# Patient Record
Sex: Female | Born: 1995 | Race: White | Hispanic: No | Marital: Married | Smoking: Never smoker
Health system: Southern US, Community
[De-identification: ages and names within clinical notes are randomized; demographics above are authoritative.]

## PROBLEM LIST (undated history)

## (undated) DIAGNOSIS — T7840XA Allergy, unspecified, initial encounter: Secondary | ICD-10-CM

## (undated) DIAGNOSIS — J45909 Unspecified asthma, uncomplicated: Secondary | ICD-10-CM

## (undated) DIAGNOSIS — K219 Gastro-esophageal reflux disease without esophagitis: Secondary | ICD-10-CM

## (undated) HISTORY — DX: Unspecified asthma, uncomplicated: J45.909

## (undated) HISTORY — PX: WISDOM TOOTH EXTRACTION: SHX21

## (undated) HISTORY — DX: Allergy, unspecified, initial encounter: T78.40XA

## (undated) HISTORY — DX: Gastro-esophageal reflux disease without esophagitis: K21.9

## (undated) HISTORY — PX: CHOLECYSTECTOMY: SHX55

---

## 2016-08-09 ENCOUNTER — Encounter: Payer: Self-pay | Admitting: *Deleted

## 2016-08-10 ENCOUNTER — Ambulatory Visit: Payer: Self-pay | Admitting: Advanced Practice Midwife

## 2016-08-11 NOTE — Progress Notes (Signed)
Chart opened in error

## 2016-09-03 ENCOUNTER — Encounter: Payer: Self-pay | Admitting: Advanced Practice Midwife

## 2017-05-03 ENCOUNTER — Encounter (HOSPITAL_COMMUNITY): Payer: Self-pay

## 2018-12-25 DIAGNOSIS — D500 Iron deficiency anemia secondary to blood loss (chronic): Secondary | ICD-10-CM | POA: Insufficient documentation

## 2019-01-09 NOTE — L&D Delivery Note (Addendum)
Patient: Amber Donovan MRN: [id number]  GBS status: GBS positive, IAP given- PCN ppx  Patient is a 24 y.o. now G3P3003 s/p NSVD at [redacted]w[redacted]d, who was admitted for eIOL. AROM 1h 42m prior to delivery with clear fluid.   Delivery Note At 6:04 PM a viable female was delivered via Vaginal, Spontaneous (Presentation: Left Occiput Anterior).  APGAR: 9, 9; weight 7 lb 9 oz (3430 g).   Placenta status: Spontaneous, Intact.  Cord: 3 vessels with the following complications: None.  Cord pH: cord gases not collected  Called to room due to precipitous delivery. Infant delivered just prior to provider entering the room in bed with nursing staff present. Infant with spontaneous cry, placed on mother's abdomen, dried and bulb suctioned. Cord clamped x 2 after 1-minute delay, and cut by mother. Cord blood drawn. Placenta delivered spontaneously with gentle cord traction. Fundus firm with massage and Pitocin. Perineum inspected and found to have a 1st degree perineal laceration, which was repaired with 3.0 vicryl with good hemostasis achieved.  Anesthesia: Local lidocaine Episiotomy: None Lacerations: 1st degree;Perineal Suture Repair: 3.0 vicryl Est. Blood Loss (mL): 125  Mom to postpartum.  Baby to Couplet care / Skin to Skin.  Simone Autry-Lott 12/02/2019, 9:12 PM  GME ATTESTATION:  I saw and evaluated the patient. I agree with the findings and the plan of care as documented in the resident's note.  Alric Seton, MD OB Fellow, Faculty [facility], Center for [facility] 12/03/2019 7:44 AM

## 2019-07-20 ENCOUNTER — Encounter: Payer: Self-pay | Admitting: *Deleted

## 2019-07-21 ENCOUNTER — Encounter: Payer: Self-pay | Admitting: Advanced Practice Midwife

## 2019-07-21 ENCOUNTER — Other Ambulatory Visit: Payer: Self-pay

## 2019-07-21 ENCOUNTER — Ambulatory Visit (INDEPENDENT_AMBULATORY_CARE_PROVIDER_SITE_OTHER): Payer: Medicaid Other | Admitting: Advanced Practice Midwife

## 2019-07-21 ENCOUNTER — Other Ambulatory Visit (HOSPITAL_COMMUNITY)
Admission: RE | Admit: 2019-07-21 | Discharge: 2019-07-21 | Disposition: A | Discharge status: Home / Self Care | Payer: Medicaid Other | Source: Ambulatory Visit | Attending: Advanced Practice Midwife | Admitting: Advanced Practice Midwife

## 2019-07-21 VITALS — BP 97/55 | HR 91 | Ht 64.0 in | Wt 154.0 lb

## 2019-07-21 DIAGNOSIS — Z3482 Encounter for supervision of other normal pregnancy, second trimester: Secondary | ICD-10-CM

## 2019-07-21 DIAGNOSIS — Z3480 Encounter for supervision of other normal pregnancy, unspecified trimester: Secondary | ICD-10-CM | POA: Insufficient documentation

## 2019-07-21 DIAGNOSIS — Z3A19 19 weeks gestation of pregnancy: Secondary | ICD-10-CM | POA: Diagnosis not present

## 2019-07-21 NOTE — Progress Notes (Signed)
Subjective:   Amber Donovan is a 24 y.o. G3P2002 at [redacted]w[redacted]d by midtrimester ultrasound being seen today for her first obstetrical visit.  Her obstetrical history is significant for SVD x 2 and does not have a problem list on file.. Patient does intend to breast feed. Pregnancy history fully reviewed.  Patient reports no complaints.  HISTORY: OB History  Gravida Para Term Preterm AB Living  3 2 2  0 0 2  SAB TAB Ectopic Multiple Live Births  0 0 0 0 2    # Outcome Date GA Lbr Len/2nd Weight Sex Delivery Anes PTL Lv  3 Current           2 Term      Vag-Spont     1 Term      Vag-Spont      Past Medical History:  Diagnosis Date  . Asthma   . GERD (gastroesophageal reflux disease)    Past Surgical History:  Procedure Laterality Date  . CHOLECYSTECTOMY    . WISDOM TOOTH EXTRACTION     Family History  Problem Relation Age of Onset  . Diabetes Mother   . Diabetes Father   . Prostate cancer Maternal Grandfather   . Diabetes Paternal Grandmother   . Hodgkin's lymphoma Paternal Grandfather    Social History   Tobacco Use  . Smoking status: Never Smoker  . Smokeless tobacco: Never Used  Vaping Use  . Vaping Use: Never used  Substance Use Topics  . Alcohol use: Never  . Drug use: Never   Allergies  Allergen Reactions  . Hydrocodone Nausea Only    Headache  . Hydrocodone-Acetaminophen Nausea And Vomiting and Other (See Comments)    Had this reaction at age 3 with wisdom teeth extraction  . Sunflower Oil Anaphylaxis   Current Outpatient Medications on File Prior to Visit  Medication Sig Dispense Refill  . Prenatal Vit-Fe Fumarate-FA (PRENATAL VITAMINS PO) Take 1 tablet by mouth daily.     No current facility-administered medications on file prior to visit.     Indications for ASA therapy (per uptodate) One of the following: Previous pregnancy with preeclampsia, especially early onset and with an adverse outcome No Multifetal gestation No Chronic hypertension  No Type 1 or 2 diabetes mellitus No Chronic kidney disease No Autoimmune disease (antiphospholipid syndrome, systemic lupus erythematosus) No   Two or more of the following: Nulliparity No Obesity (body mass index >30 kg/m2) No Family history of preeclampsia in mother or sister No Age ?35 years No Sociodemographic characteristics (African American race, low socioeconomic level) No Personal risk factors (eg, previous pregnancy with low birth weight or small for gestational age infant, previous adverse pregnancy outcome [eg, stillbirth], interval >10 years between pregnancies) No   Indications for early 1 hour GTT (per uptodate)  BMI >25 (>23 in Asian women) AND one of the following  Gestational diabetes mellitus in a previous pregnancy No Glycated hemoglobin ?5.7 percent (39 mmol/mol), impaired glucose tolerance, or impaired fasting glucose on previous testing No First-degree relative with diabetes No High-risk race/ethnicity (eg, African American, Latino, Native American, 12 American, Pacific Islander) No History of cardiovascular disease No Hypertension or on therapy for hypertension No High-density lipoprotein cholesterol level <35 mg/dL ([place] mmol/L) and/or a triglyceride level >250 mg/dL (9.83 mmol/L) No Polycystic ovary syndrome No Physical inactivity No Other clinical condition associated with insulin resistance (eg, severe obesity, acanthosis nigricans) No Previous birth of an infant weighing ?4000 g No Previous stillbirth of unknown cause No  Exam   Vitals:   07/21/19 1017 07/21/19 1019  BP: (!) 97/55   Pulse: 91   Weight: 154 lb (69.9 kg)   Height:  5\' 4"  (1.626 m)   Fetal Heart Rate (bpm): 155  Uterus:     Pelvic Exam: Perineum: no hemorrhoids, normal perineum   Vulva: normal external genitalia, no lesions   Vagina:  normal mucosa, normal discharge   Cervix: no lesions and normal, pap smear done.    Adnexa: normal adnexa and no mass, fullness, tenderness    Bony Pelvis: average  System: General: well-developed, well-nourished female in no acute distress   Breast:  normal appearance, no masses or tenderness   Skin: normal coloration and turgor, no rashes   Neurologic: oriented, normal, negative, normal mood   Extremities: normal strength, tone, and muscle mass, ROM of all joints is normal   HEENT PERRLA, extraocular movement intact and sclera clear, anicteric   Mouth/Teeth mucous membranes moist, pharynx normal without lesions and dental hygiene good   Neck supple and no masses   Cardiovascular: regular rate and rhythm   Respiratory:  no respiratory distress, normal breath sounds   Abdomen: soft, non-tender; bowel sounds normal; no masses,  no organomegaly     Assessment:   Pregnancy: G3P2002 There are no problems to display for this patient.    Plan:  1. [redacted] weeks gestation of pregnancy --Pt on Ortho Evra patch, LMP 05/08/19, so pt was expecting to be 10 weeks today.  Bedside  05/10/19 reveals 19 week pregnancy. --Pt surprised and overwhelmed, tearful at one point with news that she is due in December.  - January MFM OB COMP + 14 WK; Future - Cytology - PAP( [place]) - Obstetric panel - HIV antibody (with reflex) - Hepatitis C Antibody - Culture, OB Urine - 01-21-2006 bedside; Future - Babyscripts Schedule Optimization  2. Supervision of other normal pregnancy, antepartum --Anticipatory guidance about next visits/weeks of pregnancy given. --Next visit in 4 weeks in the office   Initial labs drawn. Continue prenatal vitamins. Discussed and offered genetic screening options, including Quad screen/AFP, NIPS testing, and option to decline testing. Benefits/risks/alternatives reviewed. Pt aware that anatomy US is form of genetic screening with lower accuracy in detecting trisomies than blood work.  Pt declines genetic screening today. First trimester screen, Quad screen and NIPS: declined. Ultrasound discussed; fetal anatomic survey:  ordered. Problem list reviewed and updated. The nature of [facility] with multiple MDs and other Advanced Practice Providers was explained to patient; also emphasized that residents, students are part of our team. Routine obstetric precautions reviewed. No follow-ups on file.   [facility], CNM 07/21/19 5:06 PM

## 2019-07-21 NOTE — Progress Notes (Signed)
Bedside U/S shows single IUP with FHT of 155 BPM.  HC measures 16.37 cm  GA [redacted]w[redacted]d and HC measures 16.87 cm GA is [redacted]w[redacted]d

## 2019-07-22 ENCOUNTER — Encounter: Payer: Self-pay | Admitting: Obstetrics and Gynecology

## 2019-07-22 LAB — OBSTETRIC PANEL
Absolute Monocytes: 482 cells/uL (ref 200–950)
Antibody Screen: NOT DETECTED
Basophils Absolute: 27 cells/uL (ref 0–200)
Basophils Relative: 0.3 %
Eosinophils Absolute: 18 cells/uL (ref 15–500)
Eosinophils Relative: 0.2 %
HCT: 40.7 % (ref 35.0–45.0)
Hemoglobin: 13.1 g/dL (ref 11.7–15.5)
Hepatitis B Surface Ag: NONREACTIVE
Lymphs Abs: 1838 cells/uL (ref 850–3900)
MCH: 30.1 pg (ref 27.0–33.0)
MCHC: 32.2 g/dL (ref 32.0–36.0)
MCV: 93.6 fL (ref 80.0–100.0)
MPV: 10.3 fL (ref 7.5–12.5)
Monocytes Relative: 5.3 %
Neutro Abs: 6734 cells/uL (ref 1500–7800)
Neutrophils Relative %: 74 %
Platelets: 203 10*3/uL (ref 140–400)
RBC: 4.35 10*6/uL (ref 3.80–5.10)
RDW: 13.1 % (ref 11.0–15.0)
RPR Ser Ql: NONREACTIVE
Rubella: 11.6 Index
Total Lymphocyte: 20.2 %
WBC: 9.1 10*3/uL (ref 3.8–10.8)

## 2019-07-22 LAB — CYTOLOGY - PAP
Chlamydia: NEGATIVE
Comment: NEGATIVE
Comment: NORMAL
Diagnosis: NEGATIVE
Neisseria Gonorrhea: NEGATIVE

## 2019-07-22 LAB — HEPATITIS C ANTIBODY
Hepatitis C Ab: NONREACTIVE
SIGNAL TO CUT-OFF: 0.01 (ref <1.00)

## 2019-07-22 LAB — HIV ANTIBODY (ROUTINE TESTING W REFLEX): HIV 1&2 Ab, 4th Generation: NONREACTIVE

## 2019-07-23 LAB — CULTURE, OB URINE

## 2019-07-23 LAB — URINE CULTURE, OB REFLEX

## 2019-07-31 ENCOUNTER — Ambulatory Visit: Payer: Medicaid Other

## 2019-08-05 ENCOUNTER — Other Ambulatory Visit: Payer: Self-pay | Admitting: *Deleted

## 2019-08-05 ENCOUNTER — Ambulatory Visit: Payer: Medicaid Other | Attending: Advanced Practice Midwife

## 2019-08-05 ENCOUNTER — Other Ambulatory Visit: Payer: Self-pay

## 2019-08-05 DIAGNOSIS — Z363 Encounter for antenatal screening for malformations: Secondary | ICD-10-CM | POA: Diagnosis not present

## 2019-08-05 DIAGNOSIS — Z3A22 22 weeks gestation of pregnancy: Secondary | ICD-10-CM | POA: Diagnosis not present

## 2019-08-05 DIAGNOSIS — Z3A19 19 weeks gestation of pregnancy: Secondary | ICD-10-CM | POA: Diagnosis present

## 2019-08-05 DIAGNOSIS — Z331 Pregnant state, incidental: Secondary | ICD-10-CM | POA: Diagnosis not present

## 2019-08-05 DIAGNOSIS — Z362 Encounter for other antenatal screening follow-up: Secondary | ICD-10-CM

## 2019-08-05 DIAGNOSIS — Z3687 Encounter for antenatal screening for uncertain dates: Secondary | ICD-10-CM | POA: Diagnosis not present

## 2019-08-05 IMAGING — US US MFM OB COMP +14 WKS
1 series · 13 of 28 positions shown · non-contrast
Comparison: none

[Series 1: us mfm ob comp +14 wks · 13 of 110 slices shown]
[im 5/110]
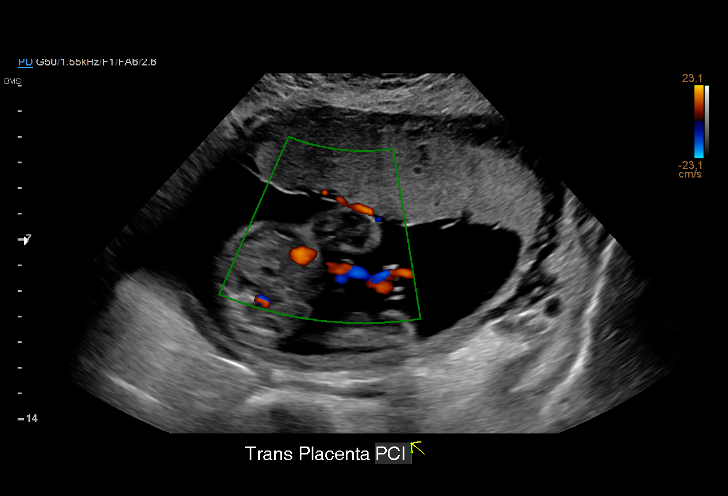
[im 13/110]
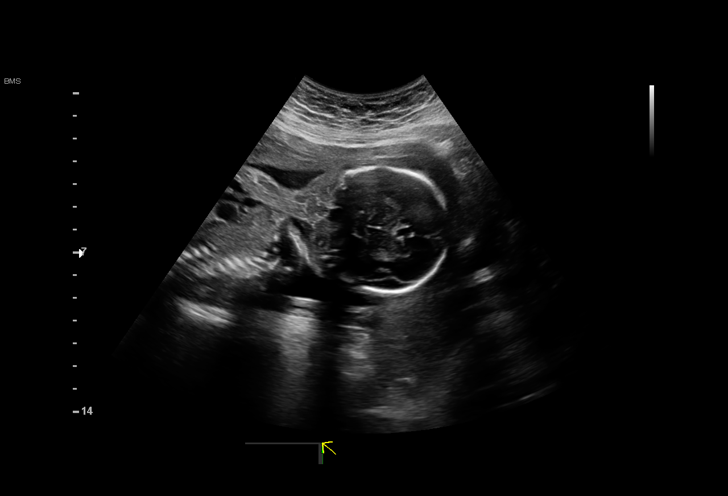
[im 21/110]
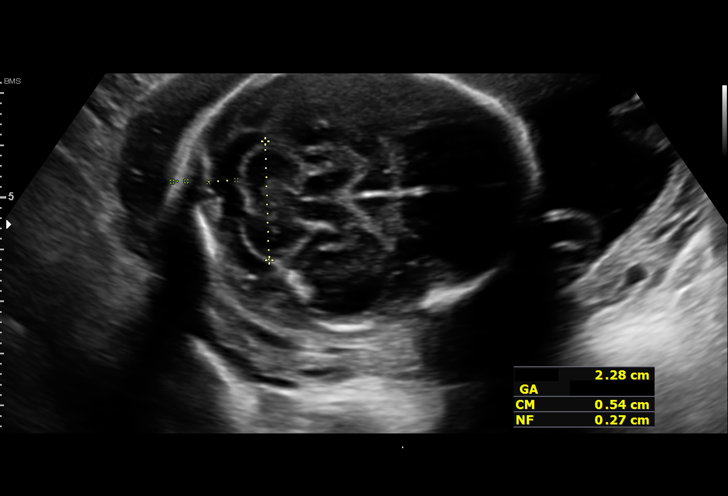
[im 29/110]
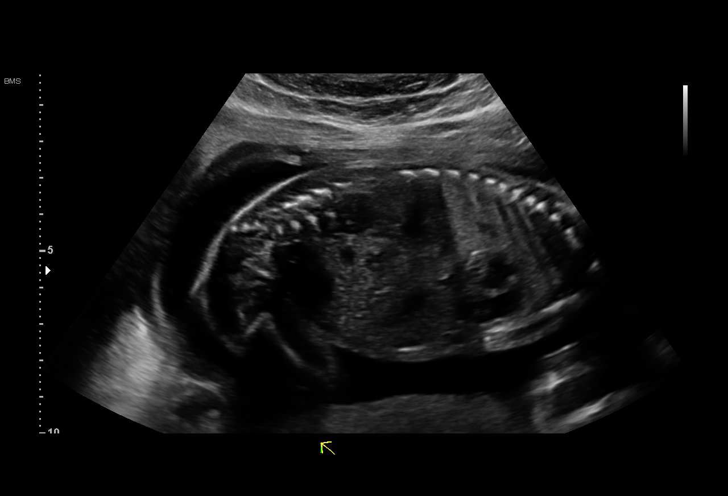
[im 37/110]
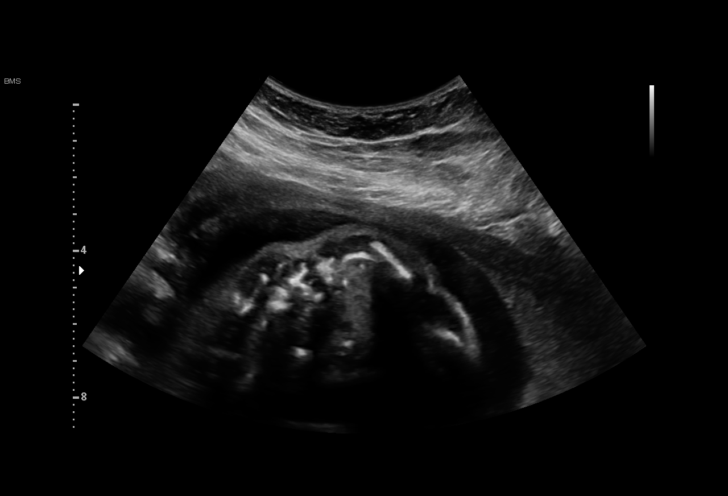
[im 45/110]
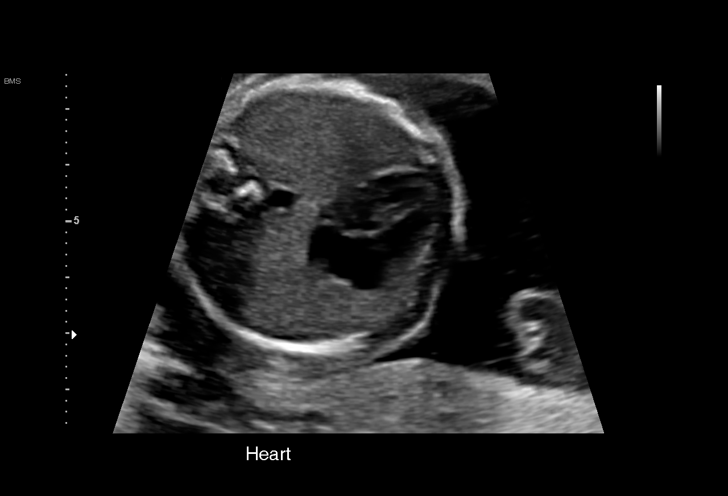
[im 57/110]
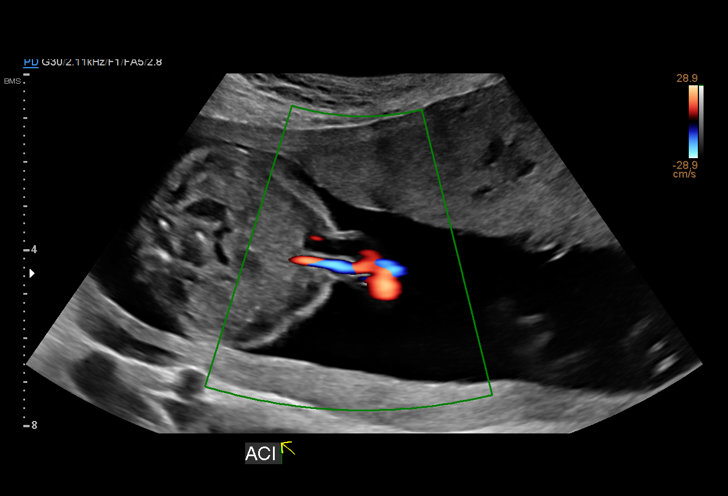
[im 65/110]
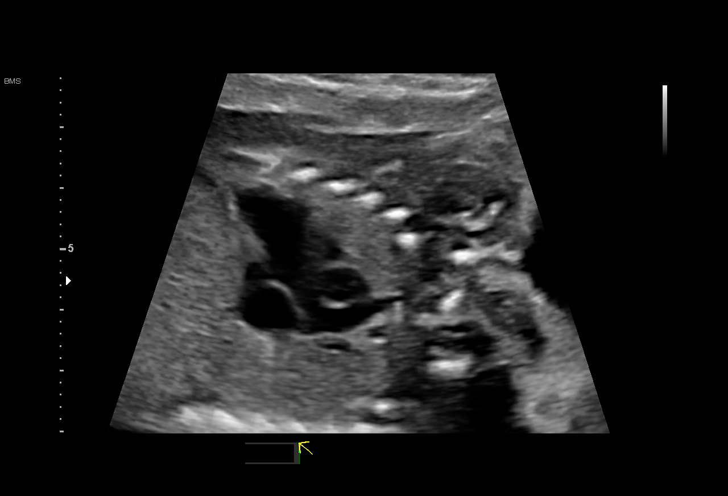
[im 73/110]
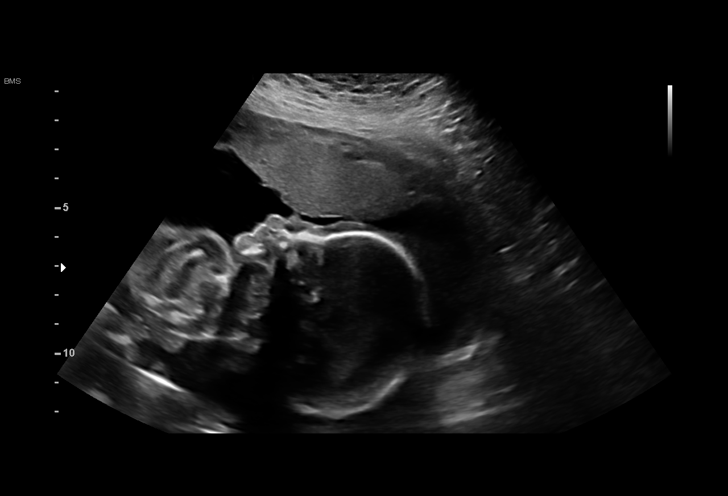
[im 81/110]
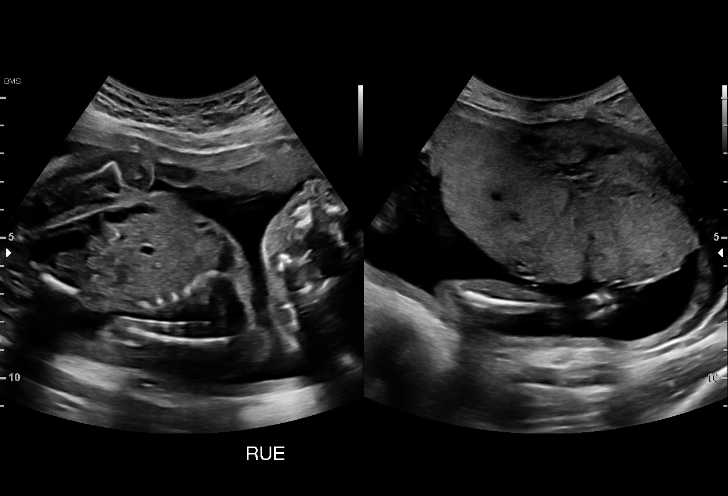
[im 89/110]
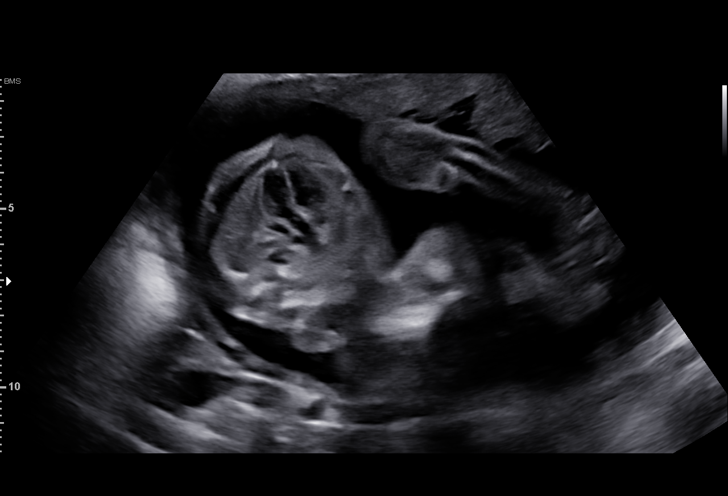
[im 97/110]
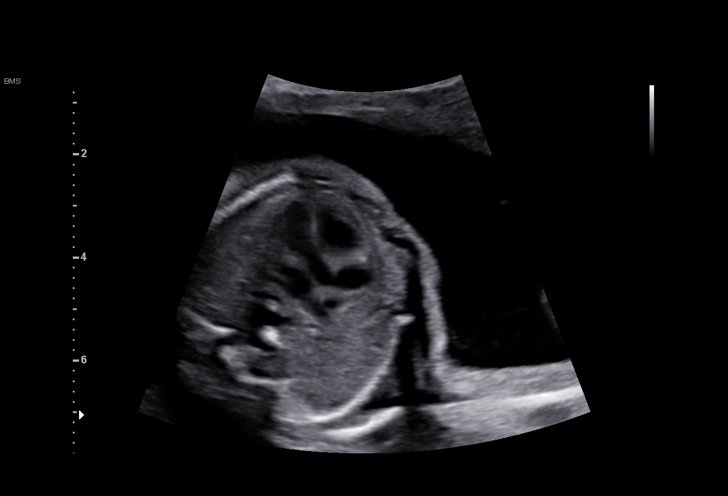
[im 105/110]
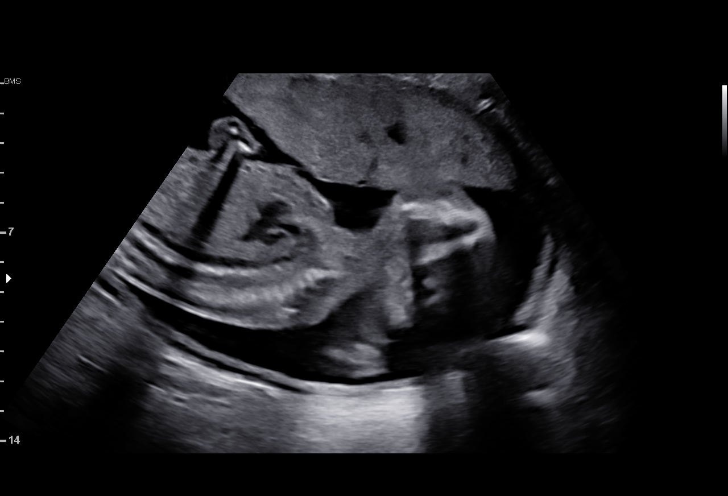

[13 of 28 positions shown; findings below may reference images not displayed]

Indications

 Encounter for uncertain dates
 Encounter for antenatal screening for
 malformations
 22 weeks gestation of pregnancy
Fetal Evaluation

 Num Of Fetuses:         1
 Fetal Heart Rate(bpm):  153
 Cardiac Activity:       Observed
 Presentation:           Cephalic
 Placenta:               Anterior
 P. Cord Insertion:      Visualized, central

 Amniotic Fluid
 AFI FV:      Within normal limits

                             Largest Pocket(cm)

Biometry

 BPD:      55.4  mm     G. Age:  22w 6d         59  %    CI:        75.49   %    70 - 86
                                                         FL/HC:      18.6   %    19.2 -
 HC:      202.2  mm     G. Age:  22w 3d         28  %    HC/AC:      1.13        1.05 -
 AC:      178.7  mm     G. Age:  22w 5d         47  %    FL/BPD:     67.9   %    71 - 87
 FL:       37.6  mm     G. Age:  22w 0d         22  %    FL/AC:      21.0   %    20 - 24
 HUM:      36.4  mm     G. Age:  22w 5d         47  %
 CER:      22.8  mm     G. Age:  21w 2d         32  %
 NFT:       2.7  mm

 LV:        4.4  mm
 CM:        5.4  mm

 Est. FW:     503  gm      1 lb 2 oz     36  %
OB History

 Gravidity:    3
 Living:       2
Gestational Age

 LMP:           12w 5d        Date:  05/08/19                 EDD:   02/12/20
 U/S Today:     22w 4d                                        EDD:   12/05/19
 Best:          22w 4d     Det. By:  U/S (08/05/19)           EDD:   12/05/19
Anatomy

 Cranium:               Appears normal         LVOT:                   Appears normal
 Cavum:                 Appears normal         Aortic Arch:            Appears normal
 Ventricles:            Appears normal         Ductal Arch:            Not well visualized
 Choroid Plexus:        Appears normal         Diaphragm:              Appears normal
 Cerebellum:            Appears normal         Stomach:                Appears normal, left
                                                                       sided
 Posterior Fossa:       Appears normal         Abdomen:                Appears normal
 Nuchal Fold:           Appears normal         Abdominal Wall:         Appears nml (cord
                                                                       insert, abd wall)
 Face:                  Appears normal         Cord Vessels:           Appears normal (3
                        (orbits and profile)                           vessel cord)
 Lips:                  Appears normal         Kidneys:                Appear normal
 Palate:                Not well visualized    Bladder:                Appears normal
 Thoracic:              Appears normal         Spine:                  Ltd views no
                                                                       intracranial signs of
                                                                       NTD
 Heart:                 Appears normal         Upper Extremities:      Appears normal
                        (4CH, axis, and
                        situs)
 RVOT:                  Not well visualized    Lower Extremities:      Appears normal
Cervix Uterus Adnexa

 Cervix
 Length:           3.93  cm.
 Normal appearance by transabdominal scan.

 Uterus
 No abnormality visualized.

 Right Ovary
 Not visualized.

 Left Ovary
 Not visualized.

 Cul De Sac
 No free fluid seen.
 Adnexa
 No adnexal mass visualized.
Comments

 This patient was seen for a detailed fetal anatomy scan as
 she presented late for prenatal care.  She reports two prior
 uncomplicated vaginal deliveries.
 She denies any significant past medical history and denies
 any problems in her current pregnancy.
 As she presented late for prenatal care, she has not had any
 screening tests for fetal aneuploidy drawn in her current
 pregnancy.
 Based on the fetal biometry measurements obtained today,
 her EDC should be changed to December 05, 2019.  There
 was normal amniotic fluid noted on today's ultrasound exam.
 There were no obvious fetal anomalies noted on today's
 ultrasound exam.  However, the views of the fetal anatomy
 were limited today due to the fetal position.
 The patient was informed that anomalies may be missed due
 to technical limitations. If the fetus is in a suboptimal position
 or maternal habitus is increased, visualization of the fetus in
 the maternal uterus may be impaired.
 A follow-up exam was scheduled in 4 weeks to complete the
 views of the fetal anatomy and to confirm her dates.

## 2019-08-18 ENCOUNTER — Ambulatory Visit (INDEPENDENT_AMBULATORY_CARE_PROVIDER_SITE_OTHER): Payer: Medicaid Other

## 2019-08-18 ENCOUNTER — Other Ambulatory Visit: Payer: Self-pay

## 2019-08-18 VITALS — BP 107/60 | HR 87 | Wt 162.0 lb

## 2019-08-18 DIAGNOSIS — Z3A24 24 weeks gestation of pregnancy: Secondary | ICD-10-CM

## 2019-08-18 DIAGNOSIS — Z3492 Encounter for supervision of normal pregnancy, unspecified, second trimester: Secondary | ICD-10-CM

## 2019-08-18 DIAGNOSIS — Z3480 Encounter for supervision of other normal pregnancy, unspecified trimester: Secondary | ICD-10-CM

## 2019-08-18 NOTE — Patient Instructions (Signed)
Safe Medications in Pregnancy   Acne: Benzoyl Peroxide Salicylic Acid  Backache/Headache: Tylenol: 2 regular strength every 4 hours OR              2 Extra strength every 6 hours  Colds/Coughs/Allergies: Benadryl (alcohol free) 25 mg every 6 hours as needed Breath right strips Claritin Cepacol throat lozenges Chloraseptic throat spray Cold-Eeze- up to three times per day Cough drops, alcohol free Flonase (by prescription only) Guaifenesin Mucinex Robitussin DM (plain only, alcohol free) Saline nasal spray/drops Sudafed (pseudoephedrine) & Actifed ** use only after [redacted] weeks gestation and if you do not have high blood pressure Tylenol Vicks Vaporub Zinc lozenges Zyrtec   Constipation: Colace Ducolax suppositories Fleet enema Glycerin suppositories Metamucil Milk of magnesia Miralax Senokot Smooth move tea  Diarrhea: Kaopectate Imodium A-D  *NO pepto Bismol  Hemorrhoids: Anusol Anusol HC Preparation H Tucks  Indigestion: Tums Maalox Mylanta Zantac  Pepcid  Insomnia: Benadryl (alcohol free) 25mg every 6 hours as needed Tylenol PM Unisom, no Gelcaps  Leg Cramps: Tums MagGel  Nausea/Vomiting:  Bonine Dramamine Emetrol Ginger extract Sea bands Meclizine  Nausea medication to take during pregnancy:  Unisom (doxylamine succinate 25 mg tablets) Take one tablet daily at bedtime. If symptoms are not adequately controlled, the dose can be increased to a maximum recommended dose of two tablets daily (1/2 tablet in the morning, 1/2 tablet mid-afternoon and one at bedtime). Vitamin B6 100mg tablets. Take one tablet twice a day (up to 200 mg per day).  Skin Rashes: Aveeno products Benadryl cream or 25mg every 6 hours as needed Calamine Lotion 1% cortisone cream  Yeast infection: Gyne-lotrimin 7 Monistat 7   **If taking multiple medications, please check labels to avoid duplicating the same active ingredients **take medication as directed on  the label ** Do not exceed 4000 mg of tylenol in 24 hours **Do not take medications that contain aspirin or ibuprofen       Coronavirus (COVID-19) and Pregnancy:  Frequently Asked Questions   How might coronavirus affect my pregnancy? The data for COVID-19 is limited, but we know that women with other coronavirus infections (such as SARS-CoV) did NOT have miscarriage or stillbirth at higher rates than the general population.  On the other hand, we know that having other respiratory viral infections during pregnancy, such as flu, has been associated with problems like low birth weight and preterm birth. Also, having a high fever early in pregnancy may increase the risk of certain birth defects.  Could I transmit coronavirus to my baby during pregnancy or delivery? Among the few case studies of infants born to mothers with COVID-19 published in peer-reviewed literature, none of the infants tested positive for the virus. And there have been no reports of mother-to-baby transmission for other coronaviruses (MERS-CoV and SARS-CoV). Also, there was no virus detected in samples of amniotic fluid or breast milk. But there have been a few reports of newborns as young as a few days old with infection, suggesting that a mother can transmit the infection to her infant through close contact after delivery.  Is it safe for me to deliver at a hospital where there have been COVID-19 cases? It should be. We know that COVID-19 is a very scary virus. The good news is that hospitals are taking great precautions to keep patients and healthcare providers safe.  According to the CDC guidelines, when a patient is even suspected to have COVID-19, they should be placed in a negative pressure room. (Think of these   rooms as vacuums that suck and filter the air so it's safe for the other people in the hospital.) If there are no rooms available, these patients should be asked to wait at home until they can be accomodated  safely. This should make it possible for you to deliver at the hospital without putting you or your baby at risk.  Hospitals are also implementing stricter visiting policies to keep patients safe. It's worth calling your hospital to check if there are any new regulations to be aware of.  What plans should I make now in case the hospital system is overwhelmed when it's time for me to deliver? Every hospital is making different plans for dealing with this scenario. Talk with your doctor or midwife once you're at least [redacted] weeks pregnant. I work in healthcare.   I work in healthcare. Should I ask my doctor to excuse me from work until the baby is born? Should I ask my doctor to excuse me from work until the baby is born? What if I work in a school, the travel industry, or some other high-risk setting? Healthcare facilities should take care to limit the exposure of pregnant employees to patients with confirmed or suspected COVID-19, just as they would with other infectious cases. If you continue working, be sure to follow the CDC's risk assessment and infection control guidelines.  If you work in a school, travel industry, or other high-risk setting, talk with your employer about what it's doing to protect employees and minimize infection risks. Wash your hands often.   What if my OB gets COVID-19? If your doctor or midwife tests positive for COVID-19, they will need to be quarantined until they recover and are no longer at risk of transmitting the virus. In this case, you'll be assigned to another OB in your doctor's practice (or you may choose another practitioner yourself). Ask your new OB or your doctor's office if you should self-quarantine or be tested for the virus. (It will depend on when you last saw your provider and when that person tested positive.)  Should we hold off on trying to conceive because of COVID-19? At this time, there's no reason to hold off on trying to get pregnant, but the  data we have is really limited. For example, we don't think the virus causes birth defects or increases your risk of miscarriage. But we don't know for sure whether you could transmit COVID-19 to your baby before or during delivery. We also don't know if the virus lives in semen or can be sexually transmitted.  We have a babymoon scheduled in the next few months - should we cancel? Yes. At this time, the virus has reached more than 140 countries, and there are travel bans to [place], most of [place], and [place]. Places where large numbers of people gather are at highest risk, especially airports and cruise ships.  If you were planning travel in the [place] note that any travel setting increases your risk of exposure, and there are already many places where everyone is being asked to stay home. To see how the virus is spreading, check The [place] Times map based on CDC data.  For the most current advice to help you avoid exposure, check the CDC's COVID-19 travel page.  Will the hospital separate me from my newborn and keep the baby in quarantine? If you don't have COVID-19 and have not been exposed to the virus, the hospital will not separate you from your newborn. If you   do test positive for COVID-19 or have been exposed but have no symptoms, the CDC, American College of Obstetricians and Gynecologists, and the Society for Maternal-Fetal Medicine all recommend that you be separated from your baby to decrease the risk of transmission to the baby. This would last until you are no longer at risk of transmitting the virus.  This scenario would, of course, be beyond heartbreaking. Talk to the hospital, your baby's pediatrician, and your family about how to plan for care of your baby in the event that you have to be separated after delivery. And try to make sure you have the emotional support you would need to endure the sadness and stress of having to potentially wait weeks to meet your newborn.   My hospital is  restricting visitors and only allowing one support person. If my support person leaves after the delivery, will they be allowed to come back? Every hospital has different policies. Contact your hospital or labor and delivery unit a week or so before delivery to get the most up-to-date restrictions. In general, if your support person needs to leave, they would be allowed back unless they knew they were exposed to COVID-19 after leaving your company.  My mom was planning to fly here to help me care for my new baby after delivery. Should I tell her not to come? Yes. If your mom is over 60 or has any serious chronic medical conditions (such as heart disease, lung disease, or diabetes), she is at higher risk of serious illness from COVID-19 and should avoid air travel. And remember that any travel setting increases a person's risk of exposure. So, it may be risky to have her around the baby after she has been traveling. For the most current advice on traveling, check the CDC's COVID-19 travel page. 

## 2019-08-18 NOTE — Progress Notes (Signed)
° °  PRENATAL VISIT NOTE  Subjective:  Amber Donovan is a 24 y.o. G3P2002 at 12w3dbeing seen today for ongoing prenatal care.  She is currently monitored for the following issues for this low-risk pregnancy and has Supervision of other normal pregnancy, antepartum on their problem list.  Patient reports fatigue. Patient has a 26year old and 26year old at home.  Contractions: Not present. Vag. Bleeding: None.  Movement: Present. Denies leaking of fluid.   The following portions of the patient's history were reviewed and updated as appropriate: allergies, current medications, past family history, past medical history, past social history, past surgical history and problem list.   Objective:   Vitals:   08/18/19 0903  BP: 107/60  Pulse: 87  Weight: 162 lb (73.5 kg)    Fetal Status: Fetal Heart Rate (bpm): 147 Fundal Height: 24 cm Movement: Present     General:  Alert, oriented and cooperative. Patient is in no acute distress.  Skin: Skin is warm and dry. No rash noted.   Cardiovascular: Normal heart rate noted  Respiratory: Normal respiratory effort, no problems with respiration noted  Abdomen: Soft, gravid, appropriate for gestational age.  Pain/Pressure: Absent     Pelvic: Cervical exam deferred        Extremities: Normal range of motion.  Edema: None  Mental Status: Normal mood and affect. Normal behavior. Normal judgment and thought content.   Assessment and Plan:  Pregnancy: G3P2002 at 226w3d. Supervision of other normal pregnancy, antepartum - No complaints. Routine care. -Reviewed NOB labs and ultrasound. Follow up anatomy scheduled -Anticipatory guidance of next visit including GTT/labs -Discussed COVID vaccine. Patient unsure but desires more information. She is concerned about reports of people dying from vaccination. Reviewed information available about risks of vaccine vs risks of COVID infection and deaths from those. Reviewed morbidity and mortality of COVID in  pregnancy.   -Patient very concerned with her weight. 7lb weight gain from prepregnancy weight. Discussed expectations for weight gain in pregnancy and normalcy of weight gain so far. Patient very concerned about her weight compared to her weight when she first met her husband. Discussed with patient normalcy of body changes after 3 pregnancies and emphasized importance of healthy food choices and exercise.  -FOB called multiple times during ROB visit. Patient very uncomfortable on phone and rushing to finish visit. Will continue to monitor for signs of abuse.   2. [redacted] weeks gestation of pregnancy   Preterm labor symptoms and general obstetric precautions including but not limited to vaginal bleeding, contractions, leaking of fluid and fetal movement were reviewed in detail with the patient. Please refer to After Visit Summary for other counseling recommendations.   Return in about 4 weeks (around 09/15/2019) for Return OB visit, 2hr GTT and labs.  Future Appointments  Date Time Provider De[place]arez8/25/2021  2:30 PM [facility]20[facility]  2:45 PM [facility] US5 [facility]/07/2019  8:50 AM [place]  08/18/19 10:02 AM

## 2019-09-02 ENCOUNTER — Ambulatory Visit: Payer: Medicaid Other

## 2019-09-11 ENCOUNTER — Ambulatory Visit: Payer: Medicaid Other

## 2019-09-16 ENCOUNTER — Other Ambulatory Visit: Payer: Self-pay

## 2019-09-16 ENCOUNTER — Ambulatory Visit (INDEPENDENT_AMBULATORY_CARE_PROVIDER_SITE_OTHER): Payer: Medicaid Other | Admitting: Obstetrics and Gynecology

## 2019-09-16 VITALS — BP 96/56 | HR 91 | Wt 168.0 lb

## 2019-09-16 DIAGNOSIS — Z3480 Encounter for supervision of other normal pregnancy, unspecified trimester: Secondary | ICD-10-CM

## 2019-09-16 DIAGNOSIS — Z3A28 28 weeks gestation of pregnancy: Secondary | ICD-10-CM

## 2019-09-16 DIAGNOSIS — Z23 Encounter for immunization: Secondary | ICD-10-CM | POA: Diagnosis not present

## 2019-09-16 NOTE — Progress Notes (Signed)
   PRENATAL VISIT NOTE  Subjective:  Amber Donovan is a 24 y.o. G3P2002 at [redacted]w[redacted]d being seen today for ongoing prenatal care.  She is currently monitored for the following issues for this low-risk pregnancy and has Supervision of other normal pregnancy, antepartum on their problem list.  Patient reports no complaints.  Contractions: Not present. Vag. Bleeding: None.  Movement: Present. Denies leaking of fluid.   The following portions of the patient's history were reviewed and updated as appropriate: allergies, current medications, past family history, past medical history, past social history, past surgical history and problem list.   Objective:   Vitals:   09/16/19 0818  BP: (!) 96/56  Pulse: 91  Weight: 168 lb (76.2 kg)    Fetal Status: Fetal Heart Rate (bpm): 143 Fundal Height: 28 cm Movement: Present     General:  Alert, oriented and cooperative. Patient is in no acute distress.  Skin: Skin is warm and dry. No rash noted.   Cardiovascular: Normal heart rate noted  Respiratory: Normal respiratory effort, no problems with respiration noted  Abdomen: Soft, gravid, appropriate for gestational age.  Pain/Pressure: Absent     Pelvic: Cervical exam deferred        Extremities: Normal range of motion.  Edema: Trace  Mental Status: Normal mood and affect. Normal behavior. Normal judgment and thought content.   Assessment and Plan:  Pregnancy: G3P2002 at [redacted]w[redacted]d 1. [redacted] weeks gestation of pregnancy  - 2Hr GTT w/ 1 Hr Carpenter 75 g - HIV antibody (with reflex) - CBC - RPR  2. Supervision of other normal pregnancy, antepartum  - is planning on getting Covid vaccine.  - 2Hr GTT w/ 1 Hr Carpenter 75 g - HIV antibody (with reflex) - CBC - RPR  Preterm labor symptoms and general obstetric precautions including but not limited to vaginal bleeding, contractions, leaking of fluid and fetal movement were reviewed in detail with the patient. Please refer to After Visit Summary for other  counseling recommendations.   No follow-ups on file.  Future Appointments  Date Time Provider Department Center  10/02/2019  8:30 AM Orva Riles, Harolyn Rutherford, NP [facility]    Venia Carbon, NP

## 2019-09-17 LAB — CBC
HCT: 31.3 % — ABNORMAL LOW (ref 35.0–45.0)
Hemoglobin: 10.5 g/dL — ABNORMAL LOW (ref 11.7–15.5)
MCH: 29.2 pg (ref 27.0–33.0)
MCHC: 33.5 g/dL (ref 32.0–36.0)
MCV: 87.2 fL (ref 80.0–100.0)
MPV: 10.6 fL (ref 7.5–12.5)
Platelets: 197 10*3/uL (ref 140–400)
RBC: 3.59 10*6/uL — ABNORMAL LOW (ref 3.80–5.10)
RDW: 12.3 % (ref 11.0–15.0)
WBC: 8.5 10*3/uL (ref 3.8–10.8)

## 2019-09-17 LAB — HIV ANTIBODY (ROUTINE TESTING W REFLEX): HIV 1&2 Ab, 4th Generation: NONREACTIVE

## 2019-09-17 LAB — 2HR GTT W 1 HR, CARPENTER, 75 G
Glucose, 1 Hr, Gest: 85 mg/dL (ref 65–179)
Glucose, 2 Hr, Gest: 71 mg/dL (ref 65–152)
Glucose, Fasting, Gest: 68 mg/dL (ref 65–91)

## 2019-09-17 LAB — RPR: RPR Ser Ql: NONREACTIVE

## 2019-09-18 ENCOUNTER — Encounter: Payer: Medicaid Other | Admitting: Obstetrics and Gynecology

## 2019-10-02 ENCOUNTER — Encounter: Payer: Self-pay | Admitting: Obstetrics and Gynecology

## 2019-10-02 ENCOUNTER — Telehealth (INDEPENDENT_AMBULATORY_CARE_PROVIDER_SITE_OTHER): Payer: Medicaid Other | Admitting: Obstetrics and Gynecology

## 2019-10-02 VITALS — BP 118/78 | Wt 160.0 lb

## 2019-10-02 DIAGNOSIS — Z3A28 28 weeks gestation of pregnancy: Secondary | ICD-10-CM | POA: Insufficient documentation

## 2019-10-02 DIAGNOSIS — K219 Gastro-esophageal reflux disease without esophagitis: Secondary | ICD-10-CM | POA: Insufficient documentation

## 2019-10-02 DIAGNOSIS — Z3480 Encounter for supervision of other normal pregnancy, unspecified trimester: Secondary | ICD-10-CM | POA: Diagnosis not present

## 2019-10-02 MED ORDER — PANTOPRAZOLE SODIUM 40 MG PO TBEC
40.0000 mg | DELAYED_RELEASE_TABLET | Freq: Every day | ORAL | 1 refills | Status: DC
Start: 2019-10-02 — End: 2019-12-04

## 2019-10-02 NOTE — Progress Notes (Signed)
   TELEHEALTH OBSTETRICS VISIT ENCOUNTER NOTE  I connected with Amber Donovan on 10/02/19 at  8:30 AM EDT by telephone at home and verified that I am speaking with the correct person using two identifiers.   I discussed the limitations, risks, security and privacy concerns of performing an evaluation and management service by telephone and the availability of in person appointments. I also discussed with the patient that there may be a patient responsible charge related to this service. The patient expressed understanding and agreed to proceed. Patient is at home, provider is in the office.   Subjective:  Amber Donovan is a 24 y.o. G3P2002 at [redacted]w[redacted]d being followed for ongoing prenatal care.  She is currently monitored for the following issues for this low-risk pregnancy and has Supervision of other normal pregnancy, antepartum; Gastroesophageal reflux disease; and [redacted] weeks gestation of pregnancy on their problem list.  Patient reports heartburn. Reports fetal movement. Denies any contractions, bleeding or leaking of fluid.   The following portions of the patient's history were reviewed and updated as appropriate: allergies, current medications, past family history, past medical history, past social history, past surgical history and problem list.   Objective:   General:  Alert, oriented and cooperative.   Mental Status: Normal mood and affect perceived. Normal judgment and thought content.  Rest of physical exam deferred due to type of encounter  Assessment and Plan:  Pregnancy: G3P2002 at [redacted]w[redacted]d  1. Gastroesophageal reflux disease, unspecified whether esophagitis present   -Hx of heartburn even as a child, Prilosec does not work. Rx: Protonix  -OTC liquid antacid as needed.    2. [redacted] weeks gestation of pregnancy  -Normal 2 hour GTT -Reviewed birth control options; desires BTL. Would like to discuss this more   Preterm labor symptoms and general obstetric precautions including  but not limited to vaginal bleeding, contractions, leaking of fluid and fetal movement were reviewed in detail with the patient.  I discussed the assessment and treatment plan with the patient. The patient was provided an opportunity to ask questions and all were answered. The patient agreed with the plan and demonstrated an understanding of the instructions. The patient was advised to call back or seek an in-person office evaluation/go to [facility]U at [facility] for any urgent or concerning symptoms. Please refer to After Visit Summary for other counseling recommendations.   I provided 11 minutes of non-face-to-face time during this encounter.  Return in about 2 weeks (around 10/16/2019) for Please schedule with MD to discuss BTL; virtual is ok .  No future appointments.  Venia Carbon, NP Center for [employer], [facility]

## 2019-10-22 ENCOUNTER — Telehealth (INDEPENDENT_AMBULATORY_CARE_PROVIDER_SITE_OTHER): Payer: Medicaid Other | Admitting: Obstetrics and Gynecology

## 2019-10-22 ENCOUNTER — Encounter: Payer: Self-pay | Admitting: Obstetrics and Gynecology

## 2019-10-22 VITALS — BP 111/79 | HR 120

## 2019-10-22 DIAGNOSIS — Z3A33 33 weeks gestation of pregnancy: Secondary | ICD-10-CM

## 2019-10-22 DIAGNOSIS — K219 Gastro-esophageal reflux disease without esophagitis: Secondary | ICD-10-CM

## 2019-10-22 DIAGNOSIS — O26893 Other specified pregnancy related conditions, third trimester: Secondary | ICD-10-CM

## 2019-10-22 DIAGNOSIS — Z3480 Encounter for supervision of other normal pregnancy, unspecified trimester: Secondary | ICD-10-CM

## 2019-10-22 NOTE — Progress Notes (Signed)
   OBSTETRICS PRENATAL VIRTUAL VISIT ENCOUNTER NOTE  Provider location: Center for [facility] [place]   I connected with [name] on 10/22/19 at  1:30 PM EDT by MyChart Video Encounter at home and verified that I am speaking with the correct person using two identifiers.   I discussed the limitations, risks, security and privacy concerns of performing an evaluation and management service virtually and the availability of in person appointments. I also discussed with the patient that there may be a patient responsible charge related to this service. The patient expressed understanding and agreed to proceed. Subjective:  Amber Donovan is a 24 y.o. G3P2002 at [redacted]w[redacted]d being seen today for ongoing prenatal care.  She is currently monitored for the following issues for this low-risk pregnancy and has Supervision of other normal pregnancy, antepartum; Gastroesophageal reflux disease; and [redacted] weeks gestation of pregnancy on their problem list.  Patient reports no complaints.  Contractions: Irritability. Vag. Bleeding: None.  Movement: Present. Denies any leaking of fluid.   The following portions of the patient's history were reviewed and updated as appropriate: allergies, current medications, past family history, past medical history, past social history, past surgical history and problem list.   Objective:   Vitals:   10/22/19 1337  BP: 111/79  Pulse: (!) 120    Fetal Status:     Movement: Present     General:  Alert, oriented and cooperative. Patient is in no acute distress.  Respiratory: Normal respiratory effort, no problems with respiration noted  Mental Status: Normal mood and affect. Normal behavior. Normal judgment and thought content.  Rest of physical exam deferred due to type of encounter  Imaging: No results found.  Assessment and Plan:  Pregnancy: G3P2002 at [redacted]w[redacted]d 1. [redacted] weeks gestation of pregnancy   2. Supervision of other normal pregnancy,  antepartum Patient reports doing well She reports significant improvement with her heartburn Patient is no longer interested in BTL and is considering her contraception options  Preterm labor symptoms and general obstetric precautions including but not limited to vaginal bleeding, contractions, leaking of fluid and fetal movement were reviewed in detail with the patient. I discussed the assessment and treatment plan with the patient. The patient was provided an opportunity to ask questions and all were answered. The patient agreed with the plan and demonstrated an understanding of the instructions. The patient was advised to call back or seek an in-person office evaluation/go to MAU at [facility] for any urgent or concerning symptoms. Please refer to After Visit Summary for other counseling recommendations.   I provided 15 minutes of face-to-face time during this encounter.  Return in about 18 days (around 11/09/2019) for in person, ROB, Low risk, GBS. Patient prefers Mondays.  No future appointments.  Catalina Antigua, MD Center for [employer], [facility]

## 2019-10-28 ENCOUNTER — Telehealth: Payer: Medicaid Other | Admitting: Nurse Practitioner

## 2019-10-28 DIAGNOSIS — R112 Nausea with vomiting, unspecified: Secondary | ICD-10-CM

## 2019-10-28 NOTE — Progress Notes (Signed)
Based on what you shared with me it looks like you have nausea and vomiting,that should be evaluated in a face to face office visit. Since you are pregnant you will need  To contact your OB doctor for treatment.    NOTE: If you entered your credit card information for this eVisit, you will not be charged. You may see a "hold" on your card for the $35 but that hold will drop off and you will not have a charge processed.  If you are having a true medical emergency please call 911.     For an urgent face to face visit,  has four urgent care centers for your convenience:   . [facility]    [phone]                  Get Driving Directions  [address] [place], [place] [address] . 10 am to 8 pm Monday-Friday . 12 pm to 8 pm Saturday-Sunday   . [facility] at [facility]  [phone]                  Get Driving Directions  [address] [place], [place] [address] . 8 am to 8 pm Monday-Friday . 9 am to 6 pm Saturday . 11 am to 6 pm Sunday   . [facility] at [facility]  [phone]                  Get Driving Directions   [address] [place], [place] [address] . 8 am to 8 pm Monday-Friday . 8 am to 4 pm Saturday-Sunday    . [facility] at [facility] Directions  [phone]  [address], [address] [place], [place] [address]  . Monday-Friday, 12 PM to 6 PM    Your e-visit answers were reviewed by a board certified advanced clinical practitioner to complete your personal care plan.  Thank you for using e-Visits.  \

## 2019-10-29 ENCOUNTER — Inpatient Hospital Stay (HOSPITAL_COMMUNITY)
Admission: AD | Admit: 2019-10-29 | Discharge: 2019-10-29 | Disposition: A | Discharge status: Home / Self Care | Payer: Medicaid Other | Attending: Obstetrics & Gynecology | Admitting: Obstetrics & Gynecology

## 2019-10-29 ENCOUNTER — Encounter (HOSPITAL_COMMUNITY): Payer: Self-pay | Admitting: Obstetrics & Gynecology

## 2019-10-29 ENCOUNTER — Other Ambulatory Visit: Payer: Self-pay

## 2019-10-29 DIAGNOSIS — O99891 Other specified diseases and conditions complicating pregnancy: Secondary | ICD-10-CM | POA: Diagnosis not present

## 2019-10-29 DIAGNOSIS — Z3A34 34 weeks gestation of pregnancy: Secondary | ICD-10-CM | POA: Diagnosis not present

## 2019-10-29 DIAGNOSIS — J45909 Unspecified asthma, uncomplicated: Secondary | ICD-10-CM | POA: Diagnosis not present

## 2019-10-29 DIAGNOSIS — O26893 Other specified pregnancy related conditions, third trimester: Secondary | ICD-10-CM | POA: Diagnosis not present

## 2019-10-29 DIAGNOSIS — O212 Late vomiting of pregnancy: Secondary | ICD-10-CM | POA: Insufficient documentation

## 2019-10-29 DIAGNOSIS — O99613 Diseases of the digestive system complicating pregnancy, third trimester: Secondary | ICD-10-CM | POA: Diagnosis not present

## 2019-10-29 DIAGNOSIS — K219 Gastro-esophageal reflux disease without esophagitis: Secondary | ICD-10-CM | POA: Diagnosis not present

## 2019-10-29 DIAGNOSIS — R1110 Vomiting, unspecified: Secondary | ICD-10-CM | POA: Diagnosis not present

## 2019-10-29 DIAGNOSIS — Z91010 Allergy to peanuts: Secondary | ICD-10-CM | POA: Diagnosis not present

## 2019-10-29 DIAGNOSIS — O98813 Other maternal infectious and parasitic diseases complicating pregnancy, third trimester: Secondary | ICD-10-CM

## 2019-10-29 DIAGNOSIS — O4703 False labor before 37 completed weeks of gestation, third trimester: Secondary | ICD-10-CM | POA: Diagnosis not present

## 2019-10-29 DIAGNOSIS — O4700 False labor before 37 completed weeks of gestation, unspecified trimester: Secondary | ICD-10-CM

## 2019-10-29 DIAGNOSIS — A059 Bacterial foodborne intoxication, unspecified: Secondary | ICD-10-CM | POA: Diagnosis not present

## 2019-10-29 DIAGNOSIS — Z885 Allergy status to narcotic agent status: Secondary | ICD-10-CM | POA: Diagnosis not present

## 2019-10-29 DIAGNOSIS — Z79899 Other long term (current) drug therapy: Secondary | ICD-10-CM | POA: Insufficient documentation

## 2019-10-29 DIAGNOSIS — R197 Diarrhea, unspecified: Secondary | ICD-10-CM | POA: Diagnosis not present

## 2019-10-29 LAB — URINALYSIS, ROUTINE W REFLEX MICROSCOPIC
Bilirubin Urine: NEGATIVE
Glucose, UA: NEGATIVE mg/dL
Hgb urine dipstick: NEGATIVE
Ketones, ur: 80 mg/dL — AB
Leukocytes,Ua: NEGATIVE
Nitrite: NEGATIVE
Protein, ur: NEGATIVE mg/dL
Specific Gravity, Urine: 1.019 (ref 1.005–1.030)
pH: 5 (ref 5.0–8.0)

## 2019-10-29 LAB — WET PREP, GENITAL
Clue Cells Wet Prep HPF POC: NONE SEEN
Sperm: NONE SEEN
Trich, Wet Prep: NONE SEEN
Yeast Wet Prep HPF POC: NONE SEEN

## 2019-10-29 MED ORDER — TERBUTALINE SULFATE 1 MG/ML IJ SOLN: 0.2500 mg | Freq: Once | INTRAMUSCULAR | Status: AC

## 2019-10-29 MED ORDER — TERBUTALINE SULFATE 1 MG/ML IJ SOLN
INTRAMUSCULAR | Status: AC
  Administered 2019-10-29: 0.25 mg via SUBCUTANEOUS
  Filled 2019-10-29: qty 1

## 2019-10-29 MED ORDER — LACTATED RINGERS IV BOLUS
1000.0000 mL | Freq: Once | INTRAVENOUS | Status: AC
  Administered 2019-10-29: 1000 mL via INTRAVENOUS

## 2019-10-29 NOTE — Discharge Instructions (Signed)
Food Poisoning Food poisoning is an illness that is caused by eating or drinking contaminated foods or drinks. In most cases, food poisoning is mild and lasts 1-2 days. However, some cases can be serious, especially for people who have weak body defense systems (immune systems), older people, children and infants, and pregnant women. What are the causes? This condition is caused by contaminated food. Foods can become contaminated with viruses, bacteria, parasites, or mold due to:  Poor personal hygiene, such as poor hand-washing practices.  Storing food improperly, such as not refrigerating raw meat.  Using unclean surfaces for preparing, serving, and storing food.  Cooking or eating with unclean utensils. If contaminated food is eaten, viruses, bacteria, or parasites can harm the intestine. This often causes severe diarrhea. The most common causes of food poisoning include:  Viruses, such as: ? Norovirus. ? Rotavirus.  Bacteria, such as: ? Salmonella. ? Listeria. ? E. coli (Escherichia coli).  Parasites, such as: ? Giardia. ? Toxoplasma gondii. What are the signs or symptoms? Symptoms may take several hours to appear after you consume contaminated food or drink. Symptoms include:  Nausea.  Vomiting.  Cramping.  Diarrhea.  Fever and chills.  Muscle aches.  Dehydration. Dehydration can cause you to be tired and thirsty, have a dry mouth, and urinate less frequently. How is this diagnosed? Your health care provider can diagnose food poisoning with your medical history and a physical exam. This will include asking you what you have recently eaten. You may also have tests, including:  Blood tests.  Stool tests. How is this treated? Treatment focuses on relieving your symptoms and making sure that you are hydrated. You may also be given medicines. In severe cases, hospitalization may be required and you may need to receive fluids through an IV. Follow these instructions  at home: Eating and drinking   Drink enough fluids to keep your urine pale yellow. You may need to drink small amounts of clear liquids frequently.  Avoid milk, caffeine, and alcohol.  Ask your health care provider for specific rehydration instructions.  Eat small, frequent meals rather than large meals. Medicines  Take over-the-counter and prescription medicines only as told by your health care provider. Ask your health care provider if you should continue to take any of your regular prescribed and over-the-counter medicines.  If you were prescribed an antibiotic medicine, take it as told by your health care provider. Do not stop taking the antibiotic even if you start to feel better. General instructions   Wash your hands thoroughly before you prepare food and after you go to the bathroom (use the toilet). Make sure that the people who live with you also wash their hands often.  Rest at home until you feel better.  Clean surfaces that you touch with a product that contains chlorine bleach.  Keep all follow-up visits as told by your health care provider. This is important. How is this prevented?  Wash your hands, food preparation surfaces, and utensils thoroughly before and after you handle raw foods.  Use separate food preparation surfaces and storage spaces for raw meat and for fruits and vegetables.  Keep refrigerated foods colder than 40F (5C).  Serve hot foods immediately or keep them heated above 140F (60C).  Store dry foods in cool, dry spaces away from excess heat or moisture. Throw out any foods that do not smell right or are in cans that are bulging.  Follow approved canning procedures.  Heat canned foods thoroughly before you taste   them.  Drink bottled or sterile water when you travel. Get help right away if:  You have difficulty breathing, swallowing, talking, or moving.  You develop blurred vision.  You cannot eat or drink without vomiting.  You  faint.  Your eyes turn yellow.  Your vomiting or diarrhea is persistent.  Abdominal pain develops, increases, or localizes in one small area.  You have a fever.  You have blood or mucus in your stools, or your stools look dark black and tarry.  You have signs of dehydration, such as: ? Dark urine, very little urine, or no urine. ? Cracked lips. ? Not making tears while crying. ? Dry mouth. ? Sunken eyes. ? Sleepiness. ? Weakness. ? Dizziness. These symptoms may represent a serious problem that is an emergency. Do not wait to see if the symptoms will go away. Get medical help right away. Call your local emergency services (911 in the [place] Do not drive yourself to the hospital. Summary  Food poisoning is an illness that is caused by eating or drinking contaminated foods or drinks.  Symptoms may include nausea, vomiting, diarrhea, muscle aches, cramping, fever, chills, and dehydration.  In most cases, food poisoning is mild and lasts 1-2 days.  In severe cases, hospitalization may be required. This information is not intended to replace advice given to you by your health care provider. Make sure you discuss any questions you have with your health care provider. Document Revised: 10/09/2017 Document Reviewed: 10/09/2017 Elsevier Patient Education  2020 Elsevier Inc.   [employer] of the uterus can occur throughout pregnancy, but they are not always a sign that you are in labor. You may have practice contractions called Braxton Hicks contractions. These false labor contractions are sometimes confused with true labor. What are Deberah Pelton contractions? Braxton Hicks contractions are tightening movements that occur in the muscles of the uterus before labor. Unlike true labor contractions, these contractions do not result in opening (dilation) and thinning of the cervix. Toward the end of pregnancy (32-34 weeks), Braxton Hicks contractions can happen  more often and may become stronger. These contractions are sometimes difficult to tell apart from true labor because they can be very uncomfortable. You should not feel embarrassed if you go to the hospital with false labor. Sometimes, the only way to tell if you are in true labor is for your health care provider to look for changes in the cervix. The health care provider will do a physical exam and may monitor your contractions. If you are not in true labor, the exam should show that your cervix is not dilating and your water has not broken. If there are no other health problems associated with your pregnancy, it is completely safe for you to be sent home with false labor. You may continue to have Braxton Hicks contractions until you go into true labor. How to tell the difference between true labor and false labor True labor  Contractions last 30-70 seconds.  Contractions become very regular.  Discomfort is usually felt in the top of the uterus, and it spreads to the lower abdomen and low back.  Contractions do not go away with walking.  Contractions usually become more intense and increase in frequency.  The cervix dilates and gets thinner. False labor  Contractions are usually shorter and not as strong as true labor contractions.  Contractions are usually irregular.  Contractions are often felt in the front of the lower abdomen and in the groin.  Contractions  may go away when you walk around or change positions while lying down.  Contractions get weaker and are shorter-lasting as time goes on.  The cervix usually does not dilate or become thin. Follow these instructions at home:   Take over-the-counter and prescription medicines only as told by your health care provider.  Keep up with your usual exercises and follow other instructions from your health care provider.  Eat and drink lightly if you think you are going into labor.  If Braxton Hicks contractions are making you  uncomfortable: ? Change your position from lying down or resting to walking, or change from walking to resting. ? Sit and rest in a tub of warm water. ? Drink enough fluid to keep your urine pale yellow. Dehydration may cause these contractions. ? Do slow and deep breathing several times an hour.  Keep all follow-up prenatal visits as told by your health care provider. This is important. Contact a health care provider if:  You have a fever.  You have continuous pain in your abdomen. Get help right away if:  Your contractions become stronger, more regular, and closer together.  You have fluid leaking or gushing from your vagina.  You pass blood-tinged mucus (bloody show).  You have bleeding from your vagina.  You have low back pain that you never had before.  You feel your baby's head pushing down and causing pelvic pressure.  Your baby is not moving inside you as much as it used to. Summary  Contractions that occur before labor are called Braxton Hicks contractions, false labor, or practice contractions.  Braxton Hicks contractions are usually shorter, weaker, farther apart, and less regular than true labor contractions. True labor contractions usually become progressively stronger and regular, and they become more frequent.  Manage discomfort from [facility] contractions by changing position, resting in a warm bath, drinking plenty of water, or practicing deep breathing. This information is not intended to replace advice given to you by your health care provider. Make sure you discuss any questions you have with your health care provider. Document Revised: 12/07/2016 Document Reviewed: 05/10/2016 Elsevier Patient Education  2020 [employer].

## 2019-10-29 NOTE — MAU Note (Signed)
Patient reports eating Dennys and then started having N/V/D afterwards.  Presented to the ED for eval of possible food poisoning where they told her she was contracting and dilated 1 cm.  Denies any complications w/ her pregnancy.  No LOF/VB.  + FM.

## 2019-10-29 NOTE — MAU Provider Note (Signed)
Chief Complaint:  Nausea, Emesis, and Contractions   First Provider Initiated Contact with Patient 10/29/19 0434     HPI: Amber Donovan is a 24 y.o. G3P2002 at [redacted]w[redacted]d who was sent to MAU via [facility] from [facility] in [place] for evaluation of preterm labor. She ate at [facility] the day before yesterday and 24hrs later began having diarrhea with an episode of emesis. She has had diarrhea all day and began having contractions q2-51min. Dr. Lendell Caprice at [facility] called with report and stated pt was "nothing on my exam" but the RN report stated the pt was 1cm. Denies vaginal bleeding, leaking of fluid, decreased fetal movement, fever, falls, or recent illness.   Past Medical History:  Diagnosis Date  . Asthma   . GERD (gastroesophageal reflux disease)    OB History  Gravida Para Term Preterm AB Living  3 2 2     2   SAB TAB Ectopic Multiple Live Births          2    # Outcome Date GA Lbr Len/2nd Weight Sex Delivery Anes PTL Lv  3 Current           2 Term      Vag-Spont     1 Term      Vag-Spont      Past Surgical History:  Procedure Laterality Date  . CHOLECYSTECTOMY    . WISDOM TOOTH EXTRACTION     Family History  Problem Relation Age of Onset  . Diabetes Mother   . Diabetes Father   . Prostate cancer Maternal Grandfather   . Diabetes Paternal Grandmother   . Hodgkin's lymphoma Paternal Grandfather    Social History   Tobacco Use  . Smoking status: Never Smoker  . Smokeless tobacco: Never Used  Vaping Use  . Vaping Use: Never used  Substance Use Topics  . Alcohol use: Never  . Drug use: Never   Allergies  Allergen Reactions  . Hydrocodone Nausea Only    Headache  . Hydrocodone-Acetaminophen Nausea And Vomiting and Other (See Comments)    Had this reaction at age 22 with wisdom teeth extraction  . Sunflower Oil Anaphylaxis   Medications Prior to Admission  Medication Sig Dispense Refill Last Dose  . pantoprazole (PROTONIX) 40 MG tablet Take 1 tablet  (40 mg total) by mouth daily. 90 tablet 1 10/28/2019 at Unknown time  . Prenatal Vit-Fe Fumarate-FA (PRENATAL VITAMINS PO) Take 1 tablet by mouth daily.       I have reviewed patient's Past Medical Hx, Surgical Hx, Family Hx, Social Hx, medications and allergies.   ROS:  Review of Systems  Constitutional: Negative for fatigue and fever.  HENT: Negative for congestion and sore throat.   Gastrointestinal: Positive for diarrhea, nausea and vomiting.    Physical Exam   Patient Vitals for the past 24 hrs:  BP Temp Pulse Resp  10/29/19 0414 113/66 98 F (36.7 C) (!) 111 19    Constitutional: Well-developed, well-nourished female in no acute distress.  Cardiovascular: normal rate & rhythm, no murmur Respiratory: normal effort, lung sounds clear throughout GI: Abd soft, non-tender, gravid appropriate for gestational age. Pos BS x 4 MS: Extremities nontender, no edema, normal ROM Neurologic: Alert and oriented x 4.  GU:      Pelvic: NEFG, physiologic discharge, no blood, cervix clean.   Dilation: Fingertip Effacement (%): 60 Cervical Position: Posterior Station: Ballotable Presentation: Vertex Exam by:: [email or web address], CNM  Dilation: Fingertip Effacement (%): 60 Cervical Position: Posterior  Station: [employer] Presentation: Vertex Exam by:: Edd Arbour, CNM   Able to feel cervix better with 2nd exam, the external os is very soft and 2-3cm, but tunnels to 1cm through to the internal os which is closed.   Fetal Tracing: reactive Baseline: 140 Variability: moderate Accelerations: present Decelerations: none Toco: q2-19min then became relaxed to UI after terbutaline   Labs: Results for orders placed or performed during the hospital encounter of 10/29/19 (from the past 24 hour(s))  Urinalysis, Routine w reflex microscopic Urine, Clean Catch     Status: Abnormal   Collection Time: 10/29/19  5:03 AM  Result Value Ref Range   Color, Urine YELLOW YELLOW   APPearance CLEAR  CLEAR   Specific Gravity, Urine 1.019 1.005 - 1.030   pH 5.0 5.0 - 8.0   Glucose, UA NEGATIVE NEGATIVE mg/dL   Hgb urine dipstick NEGATIVE NEGATIVE   Bilirubin Urine NEGATIVE NEGATIVE   Ketones, ur 80 (A) NEGATIVE mg/dL   Protein, ur NEGATIVE NEGATIVE mg/dL   Nitrite NEGATIVE NEGATIVE   Leukocytes,Ua NEGATIVE NEGATIVE  Wet prep, genital     Status: Abnormal   Collection Time: 10/29/19  7:02 AM  Result Value Ref Range   Yeast Wet Prep HPF POC NONE SEEN NONE SEEN   Trich, Wet Prep NONE SEEN NONE SEEN   Clue Cells Wet Prep HPF POC NONE SEEN NONE SEEN   WBC, Wet Prep HPF POC MANY (A) NONE SEEN   Sperm NONE SEEN     Imaging:  No results found.  MAU Course: Orders Placed This Encounter  Procedures  . Wet prep, genital  . Urinalysis, Routine w reflex microscopic Urine, Clean Catch  . Discharge patient   Meds ordered this encounter  Medications  . terbutaline (BRETHINE) 1 MG/ML injection    Cioce, Anna   : cabinet override  . terbutaline (BRETHINE) injection 0.25 mg  . lactated ringers bolus 1,000 mL    MDM: Pt had been given bolus of NS at [facility], UA still showed ketones. LR bolus ordered + a dose of terbutaline. Pt able to sleep and stated contractions spaced out and were much less intense. Wet prep normal 2nd cervical exam easier to feel through cervix completely to find a closed internal os.   Assessment: 1. Preterm contractions   2. Food poisoning     Plan: Discharge home in stable condition with preterm labor precautions.  Educated about returning to solid foods post food-poisoning Pt requests note approving return to work    Follow-up [employer] for [employer] at [place]. Go to.   Specialty: Obstetrics and Gynecology Why: your scheduled appointments for ongoing prenatal care Contact information: [address], [address] [place] [address] [phone]              Allergies as of 10/29/2019       Reactions   Hydrocodone Nausea Only   Headache   Hydrocodone-acetaminophen Nausea And Vomiting, Other (See Comments)   Had this reaction at age 53 with wisdom teeth extraction   Sunflower Oil Anaphylaxis      Medication List    TAKE these medications   pantoprazole 40 MG tablet Commonly known as: Protonix Take 1 tablet (40 mg total) by mouth daily.   PRENATAL VITAMINS PO Take 1 tablet by mouth daily.      Edd Arbour, CNM, MSN, [facility] 10/29/19 7:33 AM

## 2019-11-09 ENCOUNTER — Other Ambulatory Visit (HOSPITAL_COMMUNITY)
Admission: RE | Admit: 2019-11-09 | Discharge: 2019-11-09 | Disposition: A | Discharge status: Home / Self Care | Payer: Medicaid Other | Source: Ambulatory Visit | Attending: Obstetrics & Gynecology | Admitting: Obstetrics & Gynecology

## 2019-11-09 ENCOUNTER — Other Ambulatory Visit: Payer: Self-pay

## 2019-11-09 ENCOUNTER — Ambulatory Visit (INDEPENDENT_AMBULATORY_CARE_PROVIDER_SITE_OTHER): Payer: Medicaid Other | Admitting: Obstetrics & Gynecology

## 2019-11-09 VITALS — BP 103/55 | HR 88 | Wt 171.0 lb

## 2019-11-09 DIAGNOSIS — Z3A36 36 weeks gestation of pregnancy: Secondary | ICD-10-CM | POA: Insufficient documentation

## 2019-11-09 DIAGNOSIS — L0232 Furuncle of buttock: Secondary | ICD-10-CM | POA: Diagnosis not present

## 2019-11-09 DIAGNOSIS — O98813 Other maternal infectious and parasitic diseases complicating pregnancy, third trimester: Secondary | ICD-10-CM | POA: Insufficient documentation

## 2019-11-09 DIAGNOSIS — Z3480 Encounter for supervision of other normal pregnancy, unspecified trimester: Secondary | ICD-10-CM

## 2019-11-09 LAB — OB RESULTS CONSOLE GBS: GBS: POSITIVE

## 2019-11-09 MED ORDER — SULFAMETHOXAZOLE-TRIMETHOPRIM 800-160 MG PO TABS: 1.0000 | ORAL_TABLET | Freq: Two times a day (BID) | ORAL | 0 refills | Status: DC

## 2019-11-09 NOTE — Progress Notes (Signed)
   PRENATAL VISIT NOTE  Subjective:  Amber Donovan is a 24 y.o. G3P2002 at [redacted]w[redacted]d being seen today for ongoing prenatal care.  She is currently monitored for the following issues for this low-risk pregnancy and has Supervision of other normal pregnancy, antepartum and Gastroesophageal reflux disease on their problem list.  Patient reports pain and mass on left buttcuks cheek.  Has been soaking x2 in epson salts.  It was bigger and seems to have burst. .  Contractions: Irritability. Vag. Bleeding: None.  Movement: Present. Denies leaking of fluid.   The following portions of the patient's history were reviewed and updated as appropriate: allergies, current medications, past family history, past medical history, past social history, past surgical history and problem list.   Objective:   Vitals:   11/09/19 0957  BP: (!) 103/55  Pulse: 88  Weight: 171 lb (77.6 kg)    Fetal Status: Fetal Heart Rate (bpm): 143   Movement: Present     General:  Alert, oriented and cooperative. Patient is in no acute distress.  Skin: Skin is warm and dry. No rash noted.   Cardiovascular: Normal heart rate noted  Respiratory: Normal respiratory effort, no problems with respiration noted  Abdomen: Soft, gravid, appropriate for gestational age.  Pain/Pressure: Present     Pelvic: Cervical exam performed in the presence of a chaperone      1.5/thick/high/vertex   Extremities: Normal range of motion.  Edema: Trace  Mental Status: Normal mood and affect. Normal behavior. Normal judgment and thought content.   Assessment and Plan:  Pregnancy: G3P2002 at [redacted]w[redacted]d 1. [redacted] weeks gestation of pregnancy - Culture, beta strep (group b only) - Cervicovaginal ancillary only( [facility]) - Pt did not make f/u anatomy appt. - Pt to restart Baby Rx BPs and will take next week.   2. Supervision of other normal pregnancy, antepartum Pt has decided against BTL; will take POPs  3.  Boil Looks like it has drained. Daily  exams; sits baths bid, Bactrim for 7 days.  Term labor symptoms and general obstetric precautions including but not limited to vaginal bleeding, contractions, leaking of fluid and fetal movement were reviewed in detail with the patient. Please refer to After Visit Summary for other counseling recommendations.   No follow-ups on file.  Future Appointments  Date Time Provider Department Center  11/09/2019 10:15 AM Lesly Dukes, MD [facility]    Elsie Lincoln, MD

## 2019-11-10 LAB — CERVICOVAGINAL ANCILLARY ONLY
Chlamydia: NEGATIVE
Comment: NEGATIVE
Comment: NORMAL
Neisseria Gonorrhea: NEGATIVE

## 2019-11-12 LAB — CULTURE, BETA STREP (GROUP B ONLY)
MICRO NUMBER:: 11142861
SPECIMEN QUALITY:: ADEQUATE

## 2019-11-23 ENCOUNTER — Other Ambulatory Visit: Payer: Self-pay

## 2019-11-23 ENCOUNTER — Ambulatory Visit (INDEPENDENT_AMBULATORY_CARE_PROVIDER_SITE_OTHER): Payer: Medicaid Other | Admitting: Obstetrics & Gynecology

## 2019-11-23 VITALS — BP 114/70 | HR 85 | Wt 172.0 lb

## 2019-11-23 DIAGNOSIS — Z3480 Encounter for supervision of other normal pregnancy, unspecified trimester: Secondary | ICD-10-CM

## 2019-11-23 DIAGNOSIS — Z23 Encounter for immunization: Secondary | ICD-10-CM

## 2019-11-23 DIAGNOSIS — Z3A38 38 weeks gestation of pregnancy: Secondary | ICD-10-CM | POA: Diagnosis not present

## 2019-11-23 NOTE — Progress Notes (Signed)
   PRENATAL VISIT NOTE  Subjective:  Amber Donovan is a 24 y.o. G3P2002 at [redacted]w[redacted]d being seen today for ongoing prenatal care.  She is currently monitored for the following issues for this low-risk pregnancy and has Supervision of other normal pregnancy, antepartum and Gastroesophageal reflux disease on their problem list.  Patient reports occasional contractions.  Contractions: Irritability. Vag. Bleeding: None.  Movement: Present. Denies leaking of fluid.   The following portions of the patient's history were reviewed and updated as appropriate: allergies, current medications, past family history, past medical history, past social history, past surgical history and problem list.   Objective:   Vitals:   11/23/19 1518  BP: 114/70  Pulse: 85  Weight: 172 lb (78 kg)    Fetal Status: Fetal Heart Rate (bpm): 136   Movement: Present     General:  Alert, oriented and cooperative. Patient is in no acute distress.  Skin: Skin is warm and dry. No rash noted.   Cardiovascular: Normal heart rate noted  Respiratory: Normal respiratory effort, no problems with respiration noted  Abdomen: Soft, gravid, appropriate for gestational age.  Pain/Pressure: Present     Pelvic: Cervical exam performed in the presence of a chaperone      2.5/long/high  Extremities: Normal range of motion.  Edema: Trace  Mental Status: Normal mood and affect. Normal behavior. Normal judgment and thought content.   Assessment and Plan:  Pregnancy: G3P2002 at [redacted]w[redacted]d 1. [redacted] weeks gestation of pregnancy Cervical check  Boil better  2. Needs flu shot - Flu Vaccine QUAD 36+ mos IM  Term labor symptoms and general obstetric precautions including but not limited to vaginal bleeding, contractions, leaking of fluid and fetal movement were reviewed in detail with the patient. Please refer to After Visit Summary for other counseling recommendations.   RTC 1 week  Elsie Lincoln, MD

## 2019-11-30 ENCOUNTER — Ambulatory Visit (INDEPENDENT_AMBULATORY_CARE_PROVIDER_SITE_OTHER): Payer: Medicaid Other | Admitting: Obstetrics & Gynecology

## 2019-11-30 ENCOUNTER — Other Ambulatory Visit: Payer: Self-pay

## 2019-11-30 VITALS — BP 112/71 | HR 83 | Wt 175.0 lb

## 2019-11-30 DIAGNOSIS — Z3A39 39 weeks gestation of pregnancy: Secondary | ICD-10-CM

## 2019-11-30 NOTE — Progress Notes (Signed)
   PRENATAL VISIT NOTE  Subjective:  Amber Donovan is a 24 y.o. G3P2002 at [redacted]w[redacted]d being seen today for ongoing prenatal care.  She is currently monitored for the following issues for this low-risk pregnancy and has Supervision of other normal pregnancy, antepartum and Gastroesophageal reflux disease on their problem list.  Patient reports contractions and pubic symphysis pain.  Contractions: Irritability. Vag. Bleeding: None.  Movement: Present. Denies leaking of fluid.   The following portions of the patient's history were reviewed and updated as appropriate: allergies, current medications, past family history, past medical history, past social history, past surgical history and problem list.   Objective:   Vitals:   11/30/19 1608  BP: 112/71  Pulse: 83  Weight: 175 lb (79.4 kg)    Fetal Status: Fetal Heart Rate (bpm): 138   Movement: Present     General:  Alert, oriented and cooperative. Patient is in no acute distress.  Skin: Skin is warm and dry. No rash noted.   Cardiovascular: Normal heart rate noted  Respiratory: Normal respiratory effort, no problems with respiration noted  Abdomen: Soft, gravid, appropriate for gestational age.  Pain/Pressure: Present     Pelvic: Cervical exam performed in the presence of a chaperone        Extremities: Normal range of motion.  Edema: Trace  Mental Status: Normal mood and affect. Normal behavior. Normal judgment and thought content.   Assessment and Plan:  Pregnancy: G3P2002 at [redacted]w[redacted]d 1. [redacted] weeks gestation of pregnancy Probable pubic symphysis separation.  Pt aware or ARRIVE trial and elects to be induced.  Pt following induction protocol for tomorrow.    Term labor symptoms and general obstetric precautions including but not limited to vaginal bleeding, contractions, leaking of fluid and fetal movement were reviewed in detail with the patient. Please refer to After Visit Summary for other counseling recommendations.   No follow-ups on  file.  No future appointments.  Elsie Lincoln, MD

## 2019-12-01 ENCOUNTER — Other Ambulatory Visit: Payer: Self-pay

## 2019-12-01 ENCOUNTER — Encounter (HOSPITAL_COMMUNITY): Payer: Self-pay | Admitting: Obstetrics and Gynecology

## 2019-12-01 ENCOUNTER — Inpatient Hospital Stay (HOSPITAL_COMMUNITY): Payer: Medicaid Other

## 2019-12-01 ENCOUNTER — Inpatient Hospital Stay (HOSPITAL_COMMUNITY)
Admission: AD | Admit: 2019-12-01 | Discharge: 2019-12-04 | DRG: 807 | Disposition: A | Discharge status: Home / Self Care | Payer: Medicaid Other | Attending: Obstetrics & Gynecology | Admitting: Obstetrics & Gynecology

## 2019-12-01 ENCOUNTER — Inpatient Hospital Stay (HOSPITAL_COMMUNITY)
Admission: AD | Admit: 2019-12-01 | Payer: Medicaid Other | Source: Home / Self Care | Admitting: Obstetrics & Gynecology

## 2019-12-01 DIAGNOSIS — O26893 Other specified pregnancy related conditions, third trimester: Secondary | ICD-10-CM | POA: Diagnosis not present

## 2019-12-01 DIAGNOSIS — Z20822 Contact with and (suspected) exposure to covid-19: Secondary | ICD-10-CM | POA: Diagnosis present

## 2019-12-01 DIAGNOSIS — Z3A39 39 weeks gestation of pregnancy: Secondary | ICD-10-CM | POA: Diagnosis not present

## 2019-12-01 DIAGNOSIS — Z3480 Encounter for supervision of other normal pregnancy, unspecified trimester: Secondary | ICD-10-CM

## 2019-12-01 DIAGNOSIS — O99824 Streptococcus B carrier state complicating childbirth: Secondary | ICD-10-CM | POA: Diagnosis present

## 2019-12-01 DIAGNOSIS — Z3490 Encounter for supervision of normal pregnancy, unspecified, unspecified trimester: Secondary | ICD-10-CM | POA: Diagnosis present

## 2019-12-01 DIAGNOSIS — O9962 Diseases of the digestive system complicating childbirth: Secondary | ICD-10-CM | POA: Diagnosis not present

## 2019-12-01 DIAGNOSIS — B951 Streptococcus, group B, as the cause of diseases classified elsewhere: Secondary | ICD-10-CM | POA: Diagnosis not present

## 2019-12-01 DIAGNOSIS — K219 Gastro-esophageal reflux disease without esophagitis: Secondary | ICD-10-CM | POA: Diagnosis present

## 2019-12-01 MED ORDER — SOD CITRATE-CITRIC ACID 500-334 MG/5ML PO SOLN: 30.0000 mL | ORAL | Status: DC | PRN

## 2019-12-01 MED ORDER — OXYTOCIN BOLUS FROM INFUSION
333.0000 mL | Freq: Once | INTRAVENOUS | Status: AC
  Administered 2019-12-02: 333 mL via INTRAVENOUS

## 2019-12-01 MED ORDER — SODIUM CHLORIDE 0.9 % IV SOLN
5.0000 10*6.[IU] | Freq: Once | INTRAVENOUS | Status: AC
  Administered 2019-12-02: 5 10*6.[IU] via INTRAVENOUS
  Filled 2019-12-01: qty 5

## 2019-12-01 MED ORDER — OXYCODONE-ACETAMINOPHEN 5-325 MG PO TABS: 2.0000 | ORAL_TABLET | ORAL | Status: DC | PRN

## 2019-12-01 MED ORDER — OXYTOCIN-SODIUM CHLORIDE 30-0.9 UT/500ML-% IV SOLN: 2.5000 [IU]/h | INTRAVENOUS | Status: DC

## 2019-12-01 MED ORDER — ONDANSETRON HCL 4 MG/2ML IJ SOLN: 4.0000 mg | Freq: Four times a day (QID) | INTRAMUSCULAR | Status: DC | PRN

## 2019-12-01 MED ORDER — LIDOCAINE HCL (PF) 1 % IJ SOLN
30.0000 mL | INTRAMUSCULAR | Status: AC | PRN
  Administered 2019-12-02: 30 mL via SUBCUTANEOUS
  Filled 2019-12-01: qty 30

## 2019-12-01 MED ORDER — OXYCODONE-ACETAMINOPHEN 5-325 MG PO TABS: 1.0000 | ORAL_TABLET | ORAL | Status: DC | PRN

## 2019-12-01 MED ORDER — ACETAMINOPHEN 325 MG PO TABS
650.0000 mg | ORAL_TABLET | ORAL | Status: DC | PRN
  Administered 2019-12-02: 650 mg via ORAL
  Filled 2019-12-01: qty 2

## 2019-12-01 MED ORDER — TERBUTALINE SULFATE 1 MG/ML IJ SOLN: 0.2500 mg | Freq: Once | INTRAMUSCULAR | Status: DC | PRN

## 2019-12-01 MED ORDER — PENICILLIN G POT IN DEXTROSE 60000 UNIT/ML IV SOLN
3.0000 10*6.[IU] | INTRAVENOUS | Status: DC
  Administered 2019-12-02 (×3): 3 10*6.[IU] via INTRAVENOUS
  Filled 2019-12-01 (×3): qty 50

## 2019-12-01 MED ORDER — LACTATED RINGERS IV SOLN: INTRAVENOUS | Status: DC

## 2019-12-01 MED ORDER — LACTATED RINGERS IV SOLN: 500.0000 mL | INTRAVENOUS | Status: DC | PRN

## 2019-12-01 MED ORDER — OXYTOCIN-SODIUM CHLORIDE 30-0.9 UT/500ML-% IV SOLN
1.0000 m[IU]/min | INTRAVENOUS | Status: DC
  Administered 2019-12-02: 2 m[IU]/min via INTRAVENOUS
  Filled 2019-12-01: qty 500

## 2019-12-01 NOTE — H&P (Addendum)
OBSTETRIC ADMISSION HISTORY AND PHYSICAL  Amber Donovan is a 24 y.o. female G84P2002 with IUP at [redacted]w[redacted]d by 19 wk [place] presenting for elective Induction of Labor. She reports +FMs, No LOF, no VB, no blurry vision, headaches or peripheral edema, and RUQ pain.  She plans on bottle feeding. She requests nuvaring for birth control. She received her prenatal care at [facility]   Dating: By 19wk [place] --->  Estimated Date of Delivery: 12/05/19  Sono:    @[redacted]w[redacted]d , CWD, normal anatomy, cephalic presentation, ant placenta, 503g, 36% EFW   Prenatal History/Complications:  --Food poisoning at 34 weeks  Past Medical History: Past Medical History:  Diagnosis Date  . Asthma   . GERD (gastroesophageal reflux disease)     Past Surgical History: Past Surgical History:  Procedure Laterality Date  . CHOLECYSTECTOMY    . WISDOM TOOTH EXTRACTION      Obstetrical History: OB History    Gravida  3   Para  2   Term  2   Preterm      AB      Living  2     SAB      TAB      Ectopic      Multiple      Live Births  2           Social History Social History   Socioeconomic History  . Marital status: Married    Spouse name: Not on file  . Number of children: Not on file  . Years of education: Not on file  . Highest education level: Not on file  Occupational History  . Occupation: truck  Tobacco Use  . Smoking status: Never Smoker  . Smokeless tobacco: Never Used  Vaping Use  . Vaping Use: Never used  Substance and Sexual Activity  . Alcohol use: Never  . Drug use: Never  . Sexual activity: Yes    Partners: Male    Birth control/protection: None  Other Topics Concern  . Not on file  Social History Narrative  . Not on file   Social Determinants of Health   Financial Resource Strain:   . Difficulty of Paying Living Expenses: Not on file  Food Insecurity:   . Worried About Hospital doctor in the Last Year: Not on file  . Ran Out of Food in the Last Year:  Not on file  Transportation Needs:   . Lack of Transportation (Medical): Not on file  . Lack of Transportation (Non-Medical): Not on file  Physical Activity:   . Days of Exercise per Week: Not on file  . Minutes of Exercise per Session: Not on file  Stress:   . Feeling of Stress : Not on file  Social Connections:   . Frequency of Communication with Friends and Family: Not on file  . Frequency of Social Gatherings with Friends and Family: Not on file  . Attends Religious Services: Not on file  . Active Member of Clubs or Organizations: Not on file  . Attends Programme researcher, broadcasting/film/video Meetings: Not on file  . Marital Status: Not on file    Family History: Family History  Problem Relation Age of Onset  . Diabetes Mother   . Diabetes Father   . Prostate cancer Maternal Grandfather   . Diabetes Paternal Grandmother   . Hodgkin's lymphoma Paternal Grandfather     Allergies: Allergies  Allergen Reactions  . Hydrocodone Nausea Only    Headache  . Hydrocodone-Acetaminophen Nausea And  Vomiting and Other (See Comments)    Had this reaction at age 14 with wisdom teeth extraction  . Sunflower Oil Anaphylaxis    Medications Prior to Admission  Medication Sig Dispense Refill Last Dose  . pantoprazole (PROTONIX) 40 MG tablet Take 1 tablet (40 mg total) by mouth daily. 90 tablet 1   . Prenatal Vit-Fe Fumarate-FA (PRENATAL VITAMINS PO) Take 1 tablet by mouth daily.        Review of Systems   All systems reviewed and negative except as stated in HPI  Last menstrual period 05/08/2019, unknown if currently breastfeeding. General appearance: alert, cooperative and appears stated age Lungs: Normal respiratory effort  Abdomen: soft, non-tender; bowel sounds normal Pelvic: adequate Extremities: Homans sign is negative, no sign of DVT Presentation: cephalic Fetal monitoring baseline 120, mod variability, pos accels, no decels Uterine activity: q5-6 min   Cervix 2/40/-3   Prenatal  labs: ABO, Rh: O/RH(D) POSITIVE/-- (07/13 1135) Antibody: NO ANTIBODIES DETECTED (07/13 1135) Rubella: 11.60 (07/13 1135) RPR: NON-REACTIVE (09/08 0907)  HBsAg: NON-REACTIVE (07/13 1135)  HIV: NON-REACTIVE (09/08 0907)  GBS:   POS 2 hr Glucola  normal Genetic screening  declined Anatomy US incomplete, but nl   Prenatal Transfer Tool  Maternal Diabetes: No Genetic Screening: Declined Maternal Ultrasounds/Referrals: Normal Fetal Ultrasounds or other Referrals:  None Maternal Substance Abuse:  No Significant Maternal Medications:  None Significant Maternal Lab Results: Group B Strep positive  No results found for this or any previous visit (from the past 24 hour(s)).  Patient Active Problem List   Diagnosis Date Noted  . Gastroesophageal reflux disease 10/02/2019  . Supervision of other normal pregnancy, antepartum 07/21/2019    Assessment/Plan:  Amber Donovan is a 24 y.o. G3P2002 at [redacted]w[redacted]d here for elective IOL.   #Induction of labor: Given initial exam will proceed with cytotec x1. Reassess in four hours, likely can transition to pitocin.  #Pain: Pain meds, epidrual prn  #FWB:  cat I  #ID:  gbs positive, PCN in labor  #MOF: breast  #MOC: nuvaring #Circ:  N/A  Gita Kudo, MD  12/01/2019, 10:36 PM

## 2019-12-02 ENCOUNTER — Encounter (HOSPITAL_COMMUNITY): Payer: Self-pay | Admitting: Obstetrics and Gynecology

## 2019-12-02 DIAGNOSIS — Z3A39 39 weeks gestation of pregnancy: Secondary | ICD-10-CM | POA: Diagnosis not present

## 2019-12-02 DIAGNOSIS — O99824 Streptococcus B carrier state complicating childbirth: Secondary | ICD-10-CM | POA: Diagnosis not present

## 2019-12-02 DIAGNOSIS — B951 Streptococcus, group B, as the cause of diseases classified elsewhere: Secondary | ICD-10-CM | POA: Diagnosis not present

## 2019-12-02 LAB — RESPIRATORY PANEL BY RT PCR (FLU A&B, COVID)
Influenza A by PCR: NEGATIVE
Influenza B by PCR: NEGATIVE
SARS Coronavirus 2 by RT PCR: NEGATIVE

## 2019-12-02 LAB — CBC
HCT: 33.1 % — ABNORMAL LOW (ref 36.0–46.0)
Hemoglobin: 10.5 g/dL — ABNORMAL LOW (ref 12.0–15.0)
MCH: 25.9 pg — ABNORMAL LOW (ref 26.0–34.0)
MCHC: 31.7 g/dL (ref 30.0–36.0)
MCV: 81.5 fL (ref 80.0–100.0)
Platelets: 234 10*3/uL (ref 150–400)
RBC: 4.06 MIL/uL (ref 3.87–5.11)
RDW: 14.1 % (ref 11.5–15.5)
WBC: 10.9 10*3/uL — ABNORMAL HIGH (ref 4.0–10.5)
nRBC: 0 % (ref 0.0–0.2)

## 2019-12-02 LAB — RPR: RPR Ser Ql: NONREACTIVE

## 2019-12-02 LAB — TYPE AND SCREEN
ABO/RH(D): O POS
Antibody Screen: NEGATIVE

## 2019-12-02 MED ORDER — IBUPROFEN 600 MG PO TABS: 600.0000 mg | ORAL_TABLET | Freq: Four times a day (QID) | ORAL | Status: DC

## 2019-12-02 MED ORDER — SENNOSIDES-DOCUSATE SODIUM 8.6-50 MG PO TABS
2.0000 | ORAL_TABLET | ORAL | Status: DC
  Administered 2019-12-02 – 2019-12-03 (×2): 2 via ORAL
  Filled 2019-12-02 (×2): qty 2

## 2019-12-02 MED ORDER — BENZOCAINE-MENTHOL 20-0.5 % EX AERO
1.0000 "application " | INHALATION_SPRAY | CUTANEOUS | Status: DC | PRN
  Administered 2019-12-03: 1 via TOPICAL
  Filled 2019-12-02: qty 56

## 2019-12-02 MED ORDER — EPHEDRINE 5 MG/ML INJ: 10.0000 mg | INTRAVENOUS | Status: DC | PRN

## 2019-12-02 MED ORDER — TETANUS-DIPHTH-ACELL PERTUSSIS 5-2.5-18.5 LF-MCG/0.5 IM SUSY: 0.5000 mL | PREFILLED_SYRINGE | Freq: Once | INTRAMUSCULAR | Status: DC

## 2019-12-02 MED ORDER — PRENATAL MULTIVITAMIN CH
1.0000 | ORAL_TABLET | Freq: Every day | ORAL | Status: DC
  Administered 2019-12-03: 1 via ORAL
  Filled 2019-12-02 (×2): qty 1

## 2019-12-02 MED ORDER — PANTOPRAZOLE SODIUM 40 MG PO TBEC
40.0000 mg | DELAYED_RELEASE_TABLET | Freq: Once | ORAL | Status: AC
  Administered 2019-12-02: 40 mg via ORAL
  Filled 2019-12-02: qty 1

## 2019-12-02 MED ORDER — ACETAMINOPHEN 325 MG PO TABS
650.0000 mg | ORAL_TABLET | Freq: Four times a day (QID) | ORAL | Status: DC | PRN
  Administered 2019-12-02 – 2019-12-04 (×3): 650 mg via ORAL
  Filled 2019-12-02 (×3): qty 2

## 2019-12-02 MED ORDER — COCONUT OIL OIL: 1.0000 "application " | TOPICAL_OIL | Status: DC | PRN

## 2019-12-02 MED ORDER — MAGNESIUM HYDROXIDE 400 MG/5ML PO SUSP: 30.0000 mL | ORAL | Status: DC | PRN

## 2019-12-02 MED ORDER — FENTANYL CITRATE (PF) 100 MCG/2ML IJ SOLN: 100.0000 ug | INTRAMUSCULAR | Status: DC | PRN

## 2019-12-02 MED ORDER — FENTANYL-BUPIVACAINE-NACL 0.5-0.125-0.9 MG/250ML-% EP SOLN: 12.0000 mL/h | EPIDURAL | Status: DC | PRN

## 2019-12-02 MED ORDER — ONDANSETRON HCL 4 MG PO TABS: 4.0000 mg | ORAL_TABLET | ORAL | Status: DC | PRN

## 2019-12-02 MED ORDER — IBUPROFEN 600 MG PO TABS
600.0000 mg | ORAL_TABLET | Freq: Four times a day (QID) | ORAL | Status: DC
  Administered 2019-12-02 – 2019-12-04 (×6): 600 mg via ORAL
  Filled 2019-12-02 (×7): qty 1

## 2019-12-02 MED ORDER — ONDANSETRON HCL 4 MG/2ML IJ SOLN: 4.0000 mg | INTRAMUSCULAR | Status: DC | PRN

## 2019-12-02 MED ORDER — PHENYLEPHRINE 40 MCG/ML (10ML) SYRINGE FOR IV PUSH (FOR BLOOD PRESSURE SUPPORT): 80.0000 ug | PREFILLED_SYRINGE | INTRAVENOUS | Status: DC | PRN

## 2019-12-02 MED ORDER — DIBUCAINE (PERIANAL) 1 % EX OINT: 1.0000 "application " | TOPICAL_OINTMENT | CUTANEOUS | Status: DC | PRN

## 2019-12-02 MED ORDER — DIPHENHYDRAMINE HCL 50 MG/ML IJ SOLN: 12.5000 mg | INTRAMUSCULAR | Status: DC | PRN

## 2019-12-02 MED ORDER — SIMETHICONE 80 MG PO CHEW: 80.0000 mg | CHEWABLE_TABLET | ORAL | Status: DC | PRN

## 2019-12-02 MED ORDER — LACTATED RINGERS IV SOLN: 500.0000 mL | Freq: Once | INTRAVENOUS | Status: DC

## 2019-12-02 MED ORDER — FENTANYL CITRATE (PF) 100 MCG/2ML IJ SOLN
INTRAMUSCULAR | Status: AC
  Administered 2019-12-02: 100 ug via INTRAVENOUS
  Filled 2019-12-02: qty 2

## 2019-12-02 MED ORDER — DIPHENHYDRAMINE HCL 25 MG PO CAPS
25.0000 mg | ORAL_CAPSULE | Freq: Four times a day (QID) | ORAL | Status: DC | PRN
  Administered 2019-12-04: 25 mg via ORAL
  Filled 2019-12-02: qty 1

## 2019-12-02 MED ORDER — MISOPROSTOL 50MCG HALF TABLET
50.0000 ug | ORAL_TABLET | ORAL | Status: DC
  Administered 2019-12-02 (×3): 50 ug via BUCCAL
  Filled 2019-12-02 (×3): qty 1

## 2019-12-02 MED ORDER — WITCH HAZEL-GLYCERIN EX PADS: 1.0000 "application " | MEDICATED_PAD | CUTANEOUS | Status: DC | PRN

## 2019-12-02 MED ORDER — MEASLES, MUMPS & RUBELLA VAC IJ SOLR: 0.5000 mL | Freq: Once | INTRAMUSCULAR | Status: DC

## 2019-12-02 NOTE — Discharge Summary (Signed)
Postpartum Discharge Summary  Date of Service updated 12/03/19     Patient Name: Amber Donovan DOB: November 14, 1995 MRN: [id number]  Date of admission: 12/01/2019 Delivery date:12/02/2019  Delivering provider: Arrie Senate  Date of discharge: 12/03/2019  Admitting diagnosis: Encounter for induction of labor [Z34.90] Intrauterine pregnancy: [redacted]w[redacted]d    Secondary diagnosis:  Active Problems:   Supervision of other normal pregnancy, antepartum   Encounter for induction of labor   Vaginal delivery  Additional problems: none    Discharge diagnosis: Term Pregnancy Delivered                                              Post partum procedures:none Augmentation: AROM, Pitocin, Cytotec and IP Foley Complications: None  Hospital course: Induction of Labor With Vaginal Delivery   24y.o. yo [id number] admitted to the hospital 12/01/2019 for induction of labor.  Indication for induction: Elective.  Patient had an uncomplicated labor course as follows: Membrane Rupture Time/Date: 4:56 PM ,12/02/2019   Delivery Method:Vaginal, Spontaneous  Episiotomy: None  Lacerations:  1st degree;Perineal  Details of delivery can be found in separate delivery note.  Patient had a routine postpartum course. Patient is discharged home 12/03/19.  Newborn Data: Birth date:12/02/2019  Birth time:6:04 PM  Gender:Female  Living status:Living  Apgars:9 ,9  Weight:3430 g   Magnesium Sulfate received: No BMZ received: No Rhophylac:N/A MMR:N/A T-DaP:Given prenatally Flu: Yes Transfusion:No  Physical exam  Vitals:   12/02/19 2136 12/03/19 0140 12/03/19 0540 12/03/19 0940  BP: 106/62 104/69 103/60 94/67  Pulse: 65 68 60 (!) 59  Resp: 18 16 18 16   Temp: 98.5 F (36.9 C) 98.2 F (36.8 C) 98.4 F (36.9 C) 97.6 F (36.4 C)  TempSrc: Oral Oral Oral Oral  Weight:      Height:       General: alert, cooperative and no distress Lochia: appropriate Uterine Fundus: firm Incision:  N/A DVT Evaluation: No evidence of DVT seen on physical exam. Labs: Lab Results  Component Value Date   WBC 10.9 (H) 12/01/2019   HGB 10.5 (L) 12/01/2019   HCT 33.1 (L) 12/01/2019   MCV 81.5 12/01/2019   PLT 234 12/01/2019   No flowsheet data found. Edinburgh Score: No flowsheet data found.   After visit meds:  Allergies as of 12/03/2019      Reactions   Hydrocodone Nausea Only   Headache   Hydrocodone-acetaminophen Nausea And Vomiting, Other (See Comments)   Had this reaction at age [age]ith wisdom teeth extraction   Sunflower Oil Anaphylaxis   Seeds also      Medication List    STOP taking these medications   pantoprazole 40 MG tablet Commonly known as: Protonix   PRENATAL VITAMINS PO     TAKE these medications   acetaminophen 325 MG tablet Commonly known as: TYLENOL Take 2 tablets (650 mg total) by mouth every 6 (six) hours as needed for mild pain or moderate pain.   calcium carbonate 500 MG chewable tablet Commonly known as: TUMS - dosed in mg elemental calcium Chew 1-2 tablets by mouth daily as needed for indigestion or heartburn.   ibuprofen 600 MG tablet Commonly known as: ADVIL Take 1 tablet (600 mg total) by mouth every 6 (six) hours.        Discharge home in stable condition Infant Feeding: Breast  Infant Disposition:home with mother Discharge instruction: per After Visit Summary and Postpartum booklet. Activity: Advance as tolerated. Pelvic rest for 6 weeks.  Diet: routine diet Future Appointments: Future Appointments  Date Time Provider [place]  01/04/2020  9:15 AM Guss Bunde, MD [facility]   Follow up Visit: Message to [place] 12/02/19   Please schedule this patient for a In person postpartum visit in 4 weeks with the following provider: Any provider. Additional Postpartum F/U:n/a  Low risk pregnancy complicated by: none Delivery mode:  Vaginal, Spontaneous  Anticipated Birth Control:  nuvaring at postpartum  visit   46/27/0350 Arrie Senate, MD

## 2019-12-02 NOTE — Progress Notes (Signed)
Labor Progress Note Milyn Stapleton is a 24 y.o. G3P2002 at [redacted]w[redacted]d presented for elective IOL  S: Would like to eat something   O:  BP 95/65    Pulse 66    Temp 98.7 F (37.1 C) (Oral)    Resp 18    Ht 5\' 4"  (1.626 m)    Wt 80.2 kg    LMP 05/08/2019    BMI 30.36 kg/m  EFM: 140/mod variability/pos accels/no decels  Toco: q3-4 min   CVE: Dilation: 2.5 Effacement (%): Thick Cervical Position: Posterior Station: -3 Presentation: Vertex Exam by:: [email or web address], RN   A&P: 24 y.o. 22 [redacted]w[redacted]d here for elective IOL.  #Induction of Labor: Elective. S/p Cytotec x2. Due for another check in next 30 minutes. Consider FB if patient amenable (had hoped to avoid earlier in evening), would rediscuss.   #Pain: prefers to avoid epidural if possible  #FWB: cat I  #GBS positive, PCN     [redacted]w[redacted]d, MD 7:45 AM

## 2019-12-02 NOTE — Progress Notes (Addendum)
LABOR PROGRESS NOTE  Amber Donovan is a 24 y.o. G3P2002 at [redacted]w[redacted]d  admitted for eIOL.   Subjective: Doing well. Up on ball. Breathing through contractions.   Objective: BP 114/69   Pulse 79   Temp (!) 97.5 F (36.4 C) (Oral)   Resp 16   Ht 5\' 4"  (1.626 m)   Wt 80.2 kg   LMP 05/08/2019   BMI 30.36 kg/m  or  Vitals:   12/02/19 0436 12/02/19 0500 12/02/19 0601 12/02/19 0855  BP:  117/84 95/65 114/69  Pulse:  64 66 79  Resp: 18   16  Temp: 98.7 F (37.1 C)   (!) 97.5 F (36.4 C)  TempSrc: Oral   Oral  Weight:      Height:       Dilation: 5 Effacement (%): 60, 70 Cervical Position: Posterior Station: -3 Presentation: Vertex Exam by:: [email or web address], RN FHT: baseline rate 125 bpm, moderate varibility, 15 x 15 acel, no decel Toco: 3 mins  Labs: Lab Results  Component Value Date   WBC 10.9 (H) 12/01/2019   HGB 10.5 (L) 12/01/2019   HCT 33.1 (L) 12/01/2019   MCV 81.5 12/01/2019   PLT 234 12/01/2019    Patient Active Problem List   Diagnosis Date Noted  . Encounter for induction of labor 12/01/2019  . Gastroesophageal reflux disease 10/02/2019  . Supervision of other normal pregnancy, antepartum 07/21/2019    Assessment / Plan: 24 y.o. G3P2002 at [redacted]w[redacted]d here for eIOL.   Labor: Making cervical change. Start Pitocin. S/p FB/cytotec.  Fetal Wellbeing: Cat I Pain Control:  Maternally supported, not planning for an epidural Anticipated MOD: Vaginal  [redacted]w[redacted]d, DO 12/02/2019, 2:13 PM PGY-2, [facility]

## 2019-12-02 NOTE — Progress Notes (Signed)
LABOR PROGRESS NOTE  Amber Donovan is a 24 y.o. G3P2002 at [redacted]w[redacted]d  admitted for eIOL.   Subjective: Doing well, breathing steadily through contractions.   Objective: BP 110/70   Pulse 68   Temp 98 F (36.7 C) (Oral)   Resp 16   Ht 5\' 4"  (1.626 m)   Wt 80.2 kg   LMP 05/08/2019   BMI 30.36 kg/m  or  Vitals:   12/02/19 0500 12/02/19 0601 12/02/19 0855 12/02/19 1308  BP: 117/84 95/65 114/69 110/70  Pulse: 64 66 79 68  Resp:   16 16  Temp:   (!) 97.5 F (36.4 C) 98 F (36.7 C)  TempSrc:   Oral Oral  Weight:      Height:       Dilation: 6 Effacement (%): 80 Cervical Position: Posterior Station: -2 Presentation: Vertex Exam by:: Dr. [email or web address] FHT: baseline rate 120 bpm, moderate varibility, 15 x 15 acel, early to variable decels Toco: 2 mins  Labs: Lab Results  Component Value Date   WBC 10.9 (H) 12/01/2019   HGB 10.5 (L) 12/01/2019   HCT 33.1 (L) 12/01/2019   MCV 81.5 12/01/2019   PLT 234 12/01/2019   Patient Active Problem List   Diagnosis Date Noted  . Encounter for induction of labor 12/01/2019  . Gastroesophageal reflux disease 10/02/2019  . Supervision of other normal pregnancy, antepartum 07/21/2019    Assessment / Plan: 24 y.o. G3P2002 at [redacted]w[redacted]d here for eIOL.   Labor: s/p cytotec/FB/AROM. Continued on pit making good cervical change.  Fetal Wellbeing: Cat II, initial early decel with transition to variables. Overall reassuring variability. Maternal positioning (hands and knees) affecting tracing.  Pain Control:  Maternally supported, IV pain medication PRN Anticipated MOD: Vaginal  [redacted]w[redacted]d, DO 12/02/2019, 6:04 PM PGY-2, [facility]

## 2019-12-02 NOTE — Progress Notes (Addendum)
LABOR PROGRESS NOTE  Amber Donovan is a 24 y.o. G3P2002 at [redacted]w[redacted]d  admitted for eIOL.   Subjective: Doing well. Experiencing heartburn but otherwise resting comfortably.   Objective: BP 114/69   Pulse 79   Temp (!) 97.5 F (36.4 C) (Oral)   Resp 16   Ht 5\' 4"  (1.626 m)   Wt 80.2 kg   LMP 05/08/2019   BMI 30.36 kg/m  or  Vitals:   12/02/19 0436 12/02/19 0500 12/02/19 0601 12/02/19 0855  BP:  117/84 95/65 114/69  Pulse:  64 66 79  Resp: 18   16  Temp: 98.7 F (37.1 C)   (!) 97.5 F (36.4 C)  TempSrc: Oral   Oral  Weight:      Height:       Dilation: 2.5 Effacement (%): Thick Cervical Position: Posterior Station: -3 Presentation: Vertex Exam by:: Dr. [email or web address] FHT: baseline rate 130 bpm, moderate varibility, 15  X  15 acel, no decel Toco: 3-4 mins  Labs: Lab Results  Component Value Date   WBC 10.9 (H) 12/01/2019   HGB 10.5 (L) 12/01/2019   HCT 33.1 (L) 12/01/2019   MCV 81.5 12/01/2019   PLT 234 12/01/2019    Patient Active Problem List   Diagnosis Date Noted  . Encounter for induction of labor 12/01/2019  . Gastroesophageal reflux disease 10/02/2019  . Supervision of other normal pregnancy, antepartum 07/21/2019    Assessment / Plan: 24 y.o. G3P2002 at [redacted]w[redacted]d here for eIOL.   Labor: FB placed, buccal cytotec given (s/p x2 prior). Conisder AROM/Pit when appropriate.  Fetal Wellbeing: Cat I  Pain Control:  Maternally supported, epidural planning Anticipated MOD: Vaginal  Press Casale Autry-Lott, DO 12/02/2019, 9:20 AM PGY-2, [facility]

## 2019-12-02 NOTE — Discharge Instructions (Signed)

## 2019-12-03 MED ORDER — ACETAMINOPHEN 325 MG PO TABS
650.0000 mg | ORAL_TABLET | Freq: Four times a day (QID) | ORAL | Status: DC | PRN
Start: 2019-12-03 — End: 2021-07-13

## 2019-12-03 MED ORDER — IBUPROFEN 600 MG PO TABS
600.0000 mg | ORAL_TABLET | Freq: Four times a day (QID) | ORAL | 0 refills | Status: DC
Start: 2019-12-03 — End: 2021-07-10

## 2019-12-03 MED ORDER — OXYCODONE HCL 5 MG PO TABS: 5.0000 mg | ORAL_TABLET | ORAL | Status: DC | PRN

## 2019-12-03 MED ORDER — OXYCODONE HCL 5 MG PO TABS
10.0000 mg | ORAL_TABLET | ORAL | Status: DC | PRN
  Administered 2019-12-03: 10 mg via ORAL
  Filled 2019-12-03: qty 2

## 2019-12-04 NOTE — Progress Notes (Signed)
Per pt she has a very stable and reliable support system at home, including her husband, various family members and friends. Pt verbalizes she is excited to go home, though nervous to add another little one to her already busy life. Pt kept having this RN write the discharge instructions on the packet again so that "concerned family members could read it easily". This is when RN confirmed that pt had stab;e support at home and felt she was in a safe environment. Pt reported that she does. Rn completed discharge paperwork and walked pt to husband who was waiting outside with car.

## 2019-12-04 NOTE — Progress Notes (Signed)
Pt reports having itching to lower area of abdomen. Redness and rash noted to area. Pt states she has been wearing depends/disposable underwear that she had purchased. Area cleaned with soap and water and disposable hospital provided disposable underwear put on. PRN Benadryl 25mg  given.   , RN

## 2019-12-04 NOTE — Discharge Summary (Addendum)
Postpartum Discharge Summary    Patient Name: Amber Donovan DOB: 06-25-1995 MRN: [id number]  Date of admission: 12/01/2019 Delivery date:12/02/2019  Delivering provider: Arrie Senate  Date of discharge: 12/04/2019  Admitting diagnosis: Encounter for induction of labor [Z34.90] Intrauterine pregnancy: [redacted]w[redacted]d    Secondary diagnosis:  Active Problems:   Supervision of other normal pregnancy, antepartum   Encounter for induction of labor   Vaginal delivery  Additional problems: none    Discharge diagnosis: Term Pregnancy Delivered                                              Post partum procedures:none Augmentation: AROM, Pitocin, Cytotec and IP Foley Complications: None  Hospital course: Induction of Labor With Vaginal Delivery   24y.o. yo [id number] 32w4das admitted to the hospital 12/01/2019 for induction of labor.  Indication for induction: Elective.  Patient had an uncomplicated labor course as follows: Membrane Rupture Time/Date: 4:56 PM ,12/02/2019   Delivery Method:Vaginal, Spontaneous  Episiotomy: None  Lacerations:  1st degree;Perineal  Details of delivery can be found in separate delivery note.  Patient had a routine postpartum course. Patient is discharged home 12/04/19.  Newborn Data: Birth date:12/02/2019  Birth time:6:04 PM  Gender:Female  Living status:Living  Apgars:9 ,9  Weight:3430 g   Magnesium Sulfate received: No BMZ received: No Rhophylac:N/A MMR:N/A T-DaP:Given prenatally Flu: Yes Transfusion:No  Physical exam  Vitals:   12/03/19 0540 12/03/19 0940 12/03/19 2306 12/04/19 0524  BP: 103/60 94/67 100/82 105/72  Pulse: 60 (!) 59 (!) 55 68  Resp: _0 Temp: 98.4 F (36.9 C) 97.6 F (36.4 C) 97.6 F (36.4 C) 97.8 F (36.6 C)  TempSrc: Oral Oral Axillary Axillary  Weight:      Height:       General: alert, cooperative and no distress Lochia: appropriate Uterine Fundus: firm Incision: N/A DVT Evaluation: No  evidence of DVT seen on physical exam. Labs: Lab Results  Component Value Date   WBC 10.9 (H) 12/01/2019   HGB 10.5 (L) 12/01/2019   HCT 33.1 (L) 12/01/2019   MCV 81.5 12/01/2019   PLT 234 12/01/2019   No flowsheet data found. Edinburgh Score: Edinburgh Postnatal Depression Scale Screening Tool 12/03/2019  I have been able to laugh and see the funny side of things. 0  I have looked forward with enjoyment to things. 0  I have blamed myself unnecessarily when things went wrong. 1  I have been anxious or worried for no good reason. 1  I have felt scared or panicky for no good reason. 0  Things have been getting on top of me. 0  I have been so unhappy that I have had difficulty sleeping. 0  I have felt sad or miserable. 0  I have been so unhappy that I have been crying. 0  The thought of harming myself has occurred to me. 0  Edinburgh Postnatal Depression Scale Total 2     After visit meds:  Allergies as of 12/04/2019      Reactions   Hydrocodone Nausea Only   Headache   Hydrocodone-acetaminophen Nausea And Vomiting, Other (See Comments)   Had this reaction at age 24ith wisdom teeth extraction   Sunflower Oil Anaphylaxis   Seeds also      Medication List    STOP taking these  medications   pantoprazole 40 MG tablet Commonly known as: Protonix   PRENATAL VITAMINS PO     TAKE these medications   acetaminophen 325 MG tablet Commonly known as: TYLENOL Take 2 tablets (650 mg total) by mouth every 6 (six) hours as needed for mild pain or moderate pain.   calcium carbonate 500 MG chewable tablet Commonly known as: TUMS - dosed in mg elemental calcium Chew 1-2 tablets by mouth daily as needed for indigestion or heartburn.   ibuprofen 600 MG tablet Commonly known as: ADVIL Take 1 tablet (600 mg total) by mouth every 6 (six) hours.       Discharge home in stable condition Infant Feeding: Breast Infant Disposition:home with mother Discharge instruction: per After  Visit Summary and Postpartum booklet. Activity: Advance as tolerated. Pelvic rest for 6 weeks.  Diet: routine diet Future Appointments: Future Appointments  Date Time Provider [place]  01/04/2020  9:15 AM Guss Bunde, MD [facility]   Follow up Visit: Message to [place] 12/02/19   Please schedule this patient for a In person postpartum visit in 4 weeks with the following provider: Any provider. Additional Postpartum F/U:n/a  Low risk pregnancy complicated by: none Delivery mode:  Vaginal, Spontaneous  Anticipated Birth Control:  nuvaring at postpartum visit   12/04/2019 Layla Barter, MD  GME ATTESTATION:  I saw and evaluated the patient. I agree with the findings and the plan of care as documented in the resident's note.  Arrie Senate, MD OB Fellow, [place] [facility] [place] 12/04/2019 8:13 AM

## 2020-01-04 ENCOUNTER — Other Ambulatory Visit: Payer: Self-pay

## 2020-01-04 ENCOUNTER — Ambulatory Visit (INDEPENDENT_AMBULATORY_CARE_PROVIDER_SITE_OTHER): Payer: Medicaid Other | Admitting: Obstetrics & Gynecology

## 2020-01-04 ENCOUNTER — Encounter: Payer: Self-pay | Admitting: Obstetrics & Gynecology

## 2020-01-04 ENCOUNTER — Telehealth: Payer: Self-pay | Admitting: *Deleted

## 2020-01-04 VITALS — BP 105/71 | HR 67 | Wt 158.1 lb

## 2020-01-04 DIAGNOSIS — Z3200 Encounter for pregnancy test, result unknown: Secondary | ICD-10-CM | POA: Diagnosis not present

## 2020-01-04 DIAGNOSIS — Z3202 Encounter for pregnancy test, result negative: Secondary | ICD-10-CM

## 2020-01-04 MED ORDER — ETONOGESTREL-ETHINYL ESTRADIOL 0.12-0.015 MG/24HR VA RING: VAGINAL_RING | VAGINAL | 12 refills | Status: DC

## 2020-01-04 NOTE — Telephone Encounter (Signed)
Informed patient of appointment time and arrival time.

## 2020-01-04 NOTE — Progress Notes (Signed)
    Post Partum Visit Note  Amber Donovan is a 24 y.o. G36P3003 female who presents for a postpartum visit. She is 4 weeks postpartum following a normal spontaneous vaginal delivery.  I have fully reviewed the prenatal and intrapartum course. The delivery was at [redacted]w[redacted]d gestational weeks.  Anesthesia: local. Postpartum course has been uncomplicated. Baby is doing well. Baby is feeding by bottle - Amber Donovan. Bleeding staining only. Bowel function is normal. Bladder function is normal. Patient is sexually active. Pt has had intercourse unprotected several times with last encounter about 1 week ago.  Pt has bled for most of her post partum period with staining only now. Contraception method is none. Postpartum depression screening: negative.   The pregnancy intention screening data noted above was reviewed. Potential methods of contraception were discussed. The patient elected to proceed with Vaginal Ring.     The following portions of the patient's history were reviewed and updated as appropriate: allergies, current medications, past family history, past medical history, past social history, past surgical history and problem list.  Review of Systems Pertinent items noted in HPI and remainder of comprehensive ROS otherwise negative.    Objective:  LMP 05/08/2019    General:  Pt declined all physical exam today.   Breasts:    Lungs:   Heart:    Abdomen:    Vulva:   Vagina:   Cervix:    Corpus:   Adnexa:    Rectal Exam:         Assessment:    Normal but limited postpartum exam.   Plan:   Essential components of care per ACOG recommendations:  1.  Mood and well being: Patient with negative depression screening today. Reviewed local resources for support.  - Patient does not use tobacco.  - hx of drug use? No   2. Infant care and feeding:  -Patient currently breastmilk feeding? No  Has family support. -Social determinants of health (SDOH) reviewed in EPIC. Pt requesting  letter for rent assistance.  Given today.  Pt will contact us if she would like SW referral.   3. Sexuality, contraception and birth spacing - Patient does not want a pregnancy in the next year.  Desired family size is 4 children.  - Reviewed forms of contraception in tiered fashion. Patient desired NuvaRing vaginal inserts today.   - Discussed birth spacing of 18 months - Beta hcg today as patient has had intercourse unprotected.  Pt will start Nuva Ring today and take home UPT in 2 weeks.  If postive she will remove Nuva Ring and call office.  Abstinence or condoms for first 2 weeks of Nuva Ring.   4. Sleep and fatigue -Encouraged family/partner/community support of 4 hrs of uninterrupted sleep to help with mood and fatigue  5. Physical Recovery  - Discussed patients delivery - Patient had a 1sy degree laceration, perineal healing reviewed. Patient expressed understanding.  Pt declines exam today - Patient has urinary incontinence? Yes  Pt would like to try kegals and if not better will let us know for PT referral - Patient is safe to resume physical and sexual activity  6.  Health Maintenance - Last pap smear done 07/2019 and was normal   Elsie Lincoln, MD [facility] for [employer], [place] [facility]

## 2020-01-05 LAB — HCG, QUANTITATIVE, PREGNANCY: HCG, Total, QN: <3 m[IU]/mL

## 2020-06-17 DIAGNOSIS — R52 Pain, unspecified: Secondary | ICD-10-CM | POA: Diagnosis not present

## 2020-06-17 DIAGNOSIS — R5383 Other fatigue: Secondary | ICD-10-CM | POA: Diagnosis not present

## 2020-06-17 DIAGNOSIS — M79671 Pain in right foot: Secondary | ICD-10-CM | POA: Diagnosis not present

## 2020-06-17 DIAGNOSIS — L600 Ingrowing nail: Secondary | ICD-10-CM | POA: Diagnosis not present

## 2020-06-17 DIAGNOSIS — M79672 Pain in left foot: Secondary | ICD-10-CM | POA: Diagnosis not present

## 2020-07-27 DIAGNOSIS — Z20822 Contact with and (suspected) exposure to covid-19: Secondary | ICD-10-CM | POA: Diagnosis not present

## 2020-07-27 DIAGNOSIS — M542 Cervicalgia: Secondary | ICD-10-CM | POA: Diagnosis not present

## 2021-05-05 DIAGNOSIS — Z20822 Contact with and (suspected) exposure to covid-19: Secondary | ICD-10-CM | POA: Diagnosis not present

## 2021-05-05 DIAGNOSIS — J019 Acute sinusitis, unspecified: Secondary | ICD-10-CM | POA: Diagnosis not present

## 2021-07-10 ENCOUNTER — Encounter: Payer: Self-pay | Admitting: Obstetrics and Gynecology

## 2021-07-10 DIAGNOSIS — Z3480 Encounter for supervision of other normal pregnancy, unspecified trimester: Secondary | ICD-10-CM | POA: Insufficient documentation

## 2021-07-10 NOTE — Progress Notes (Unsigned)
Last pap 7/21

## 2021-07-13 ENCOUNTER — Encounter: Payer: Self-pay | Admitting: Obstetrics and Gynecology

## 2021-07-13 ENCOUNTER — Ambulatory Visit (INDEPENDENT_AMBULATORY_CARE_PROVIDER_SITE_OTHER): Payer: Medicaid Other

## 2021-07-13 ENCOUNTER — Other Ambulatory Visit (HOSPITAL_COMMUNITY)
Admission: RE | Admit: 2021-07-13 | Discharge: 2021-07-13 | Disposition: A | Discharge status: Home / Self Care | Payer: Medicaid Other | Source: Ambulatory Visit | Attending: Obstetrics and Gynecology | Admitting: Obstetrics and Gynecology

## 2021-07-13 ENCOUNTER — Ambulatory Visit (INDEPENDENT_AMBULATORY_CARE_PROVIDER_SITE_OTHER): Payer: Medicaid Other | Admitting: Obstetrics and Gynecology

## 2021-07-13 VITALS — BP 104/64 | HR 89 | Wt 163.0 lb

## 2021-07-13 DIAGNOSIS — Z3480 Encounter for supervision of other normal pregnancy, unspecified trimester: Secondary | ICD-10-CM

## 2021-07-13 DIAGNOSIS — Z3481 Encounter for supervision of other normal pregnancy, first trimester: Secondary | ICD-10-CM

## 2021-07-13 DIAGNOSIS — Z3A11 11 weeks gestation of pregnancy: Secondary | ICD-10-CM

## 2021-07-13 DIAGNOSIS — O26811 Pregnancy related exhaustion and fatigue, first trimester: Secondary | ICD-10-CM | POA: Diagnosis not present

## 2021-07-13 DIAGNOSIS — Z3A10 10 weeks gestation of pregnancy: Secondary | ICD-10-CM | POA: Diagnosis not present

## 2021-07-13 DIAGNOSIS — Z3009 Encounter for other general counseling and advice on contraception: Secondary | ICD-10-CM | POA: Insufficient documentation

## 2021-07-13 DIAGNOSIS — R8271 Bacteriuria: Secondary | ICD-10-CM

## 2021-07-13 LAB — OB RESULTS CONSOLE GBS: GBS: POSITIVE

## 2021-07-13 NOTE — Progress Notes (Signed)
INITIAL PRENATAL VISIT NOTE  Subjective:  Amber Donovan is a 26 y.o. G4P3003 at [redacted]w[redacted]d by LMP being seen today for her initial prenatal visit. She has an obstetric history significant for 3 x SVD. She has a medical history significant for asthma and GERD.  Patient reports fatigue, much worse than in her other pregnancies. Reports some cramping last night and one episode of brown spotting today.  Contractions: Not present. Vag. Bleeding: None.  Movement: Absent. Denies leaking of fluid.    Past Medical History:  Diagnosis Date   Asthma    GERD (gastroesophageal reflux disease)     Past Surgical History:  Procedure Laterality Date   CHOLECYSTECTOMY     WISDOM TOOTH EXTRACTION      OB History  Gravida Para Term Preterm AB Living  4 3 3     3   SAB IAB Ectopic Multiple Live Births        0 3    # Outcome Date GA Lbr Len/2nd Weight Sex Delivery Anes PTL Lv  4 Current           3 Term 12/02/19 [redacted]w[redacted]d 03:04 7 lb 9 oz (3.43 kg) F Vag-Spont Local  LIV     Birth Comments: WNL   2 Term      Vag-Spont     1 Term      Vag-Spont       Social History   Socioeconomic History   Marital status: Married    Spouse name: Not on file   Number of children: Not on file   Years of education: Not on file   Highest education level: Not on file  Occupational History   Occupation: truck driver  Tobacco Use   Smoking status: Never   Smokeless tobacco: Never  Vaping Use   Vaping Use: Never used  Substance and Sexual Activity   Alcohol use: Never   Drug use: Never   Sexual activity: Yes    Partners: Male    Birth control/protection: None  Other Topics Concern   Not on file  Social History Narrative   Not on file   Social Determinants of Health   Financial Resource Strain: Not on file  Food Insecurity: Not on file  Transportation Needs: Not on file  Physical Activity: Not on file  Stress: Not on file  Social Connections: Not on file    Family History  Problem Relation Age of  Onset   Diabetes Mother    Diabetes Father    Prostate cancer Maternal Grandfather    Diabetes Paternal Grandmother    Hodgkin's lymphoma Paternal Grandfather      Current Outpatient Medications:    Prenatal Vit-Fe Fumarate-FA (PRENATAL MULTIVITAMIN) TABS tablet, Take 1 tablet by mouth daily at 12 noon., Disp: , Rfl:   Allergies  Allergen Reactions   Hydrocodone Nausea Only    Headache   Hydrocodone-Acetaminophen Nausea And Vomiting and Other (See Comments)    Had this reaction at age 41 with wisdom teeth extraction   Sunflower Oil Anaphylaxis    Seeds also    Review of Systems: Negative except for what is mentioned in HPI.  Objective:   Vitals:   07/13/21 1446  BP: 104/64  Pulse: 89  Weight: 163 lb (73.9 kg)    Fetal Status: Fetal Heart Rate (bpm): 159   Movement: Absent     Physical Exam: BP 104/64   Pulse 89   Wt 163 lb (73.9 kg)   LMP 04/28/2021  BMI 27.98 kg/m  CONSTITUTIONAL: Well-developed, well-nourished female in no acute distress.  NEUROLOGIC: Alert and oriented to person, place, and time. Normal reflexes, muscle tone coordination. No cranial nerve deficit noted. PSYCHIATRIC: Normal mood and affect. Normal behavior. Normal judgment and thought content. SKIN: Skin is warm and dry. No rash noted. Not diaphoretic. No erythema. No pallor. HENT:  Normocephalic, atraumatic, External right and left ear normal. Oropharynx is clear and moist EYES: Conjunctivae and EOM are normal. Pupils are equal, round, and reactive to light. No scleral icterus.  NECK: Normal range of motion, supple, no masses CARDIOVASCULAR: Normal heart rate noted, regular rhythm RESPIRATORY: Effort and breath sounds normal, no problems with respiration noted BREASTS: symmetric, non-tender, no masses palpable ABDOMEN: Soft, nontender, nondistended, gravid. GU: normal appearing external female genitalia, multiparous normal appearing cervix, scant white discharge in vagina, no lesions noted, no  blood noted in vault Bimanual: 10 weeks sized uterus, no adnexal tenderness or palpable lesions noted MUSCULOSKELETAL: Normal range of motion. EXT:  No edema and no tenderness. 2+ distal pulses.   Assessment and Plan:  Pregnancy: G4P3003 at [redacted]w[redacted]d by LMP  1. Supervision of other normal pregnancy, antepartum Reviewed [facility] for [employer] structure, multiple providers, fellows, medical students, virtual visits, MyChart.  - Obstetric panel - HIV antibody (with reflex) - Hepatitis C Antibody - US OB Limited; Future - Culture, OB Urine - Babyscripts Schedule Optimization - GC/Chlamydia probe amp ([place])not at [facility] - Panorama Prenatal Test Full Panel  2. Unwanted fertility Will need to sign BTL papers  3. Pregnancy related fatigue in first trimester - TSH   Preterm labor symptoms and general obstetric precautions including but not limited to vaginal bleeding, contractions, leaking of fluid and fetal movement were reviewed in detail with the patient.  Please refer to After Visit Summary for other counseling recommendations.   Return in about 4 weeks (around 08/10/2021) for low OB, in person.  Conan Bowens 07/13/2021 3:29 PM

## 2021-07-14 LAB — GC/CHLAMYDIA PROBE AMP (~~LOC~~) NOT AT ARMC
Chlamydia: NEGATIVE
Comment: NEGATIVE
Comment: NORMAL
Neisseria Gonorrhea: NEGATIVE

## 2021-07-15 LAB — OBSTETRIC PANEL
Absolute Monocytes: 537 cells/uL (ref 200–950)
Antibody Screen: NOT DETECTED
Basophils Absolute: 47 cells/uL (ref 0–200)
Basophils Relative: 0.6 %
Eosinophils Absolute: 71 cells/uL (ref 15–500)
Eosinophils Relative: 0.9 %
HCT: 36.4 % (ref 35.0–45.0)
Hemoglobin: 12.4 g/dL (ref 11.7–15.5)
Hepatitis B Surface Ag: NONREACTIVE
Lymphs Abs: 1864 cells/uL (ref 850–3900)
MCH: 31.6 pg (ref 27.0–33.0)
MCHC: 34.1 g/dL (ref 32.0–36.0)
MCV: 92.6 fL (ref 80.0–100.0)
MPV: 11 fL (ref 7.5–12.5)
Monocytes Relative: 6.8 %
Neutro Abs: 5380 cells/uL (ref 1500–7800)
Neutrophils Relative %: 68.1 %
Platelets: 169 10*3/uL (ref 140–400)
RBC: 3.93 10*6/uL (ref 3.80–5.10)
RDW: 12.8 % (ref 11.0–15.0)
RPR Ser Ql: NONREACTIVE
Rubella: 10.2 Index
Total Lymphocyte: 23.6 %
WBC: 7.9 10*3/uL (ref 3.8–10.8)

## 2021-07-15 LAB — TSH: TSH: 1.01 mIU/L

## 2021-07-15 LAB — HEPATITIS C ANTIBODY: Hepatitis C Ab: NONREACTIVE

## 2021-07-15 LAB — HIV ANTIBODY (ROUTINE TESTING W REFLEX): HIV 1&2 Ab, 4th Generation: NONREACTIVE

## 2021-07-16 LAB — URINE CULTURE, OB REFLEX

## 2021-07-16 LAB — CULTURE, OB URINE

## 2021-07-17 ENCOUNTER — Other Ambulatory Visit: Payer: Self-pay | Admitting: Family Medicine

## 2021-07-17 ENCOUNTER — Encounter: Payer: Self-pay | Admitting: Obstetrics and Gynecology

## 2021-07-17 DIAGNOSIS — R8271 Bacteriuria: Secondary | ICD-10-CM | POA: Insufficient documentation

## 2021-07-17 MED ORDER — AMOXICILLIN 500 MG PO CAPS: 500.0000 mg | ORAL_CAPSULE | Freq: Three times a day (TID) | ORAL | 0 refills | Status: AC

## 2021-08-04 NOTE — Progress Notes (Unsigned)
   PRENATAL VISIT NOTE  Subjective:  Amber Donovan is a 26 y.o. G4P3003 at [redacted]w[redacted]d being seen today for ongoing prenatal care.  She is currently monitored for the following issues for this low-risk pregnancy and has Gastroesophageal reflux disease; Supervision of other normal pregnancy, antepartum; Unwanted fertility; and GBS bacteriuria on their problem list.  Patient reports {[id number]}.   .  .   . Denies leaking of fluid.   The following portions of the patient's history were reviewed and updated as appropriate: allergies, current medications, past family history, past medical history, past social history, past surgical history and problem list.   Objective:  There were no vitals filed for this visit.  Fetal Status:           General:  Alert, oriented and cooperative. Patient is in no acute distress.  Skin: Skin is warm and dry. No rash noted.   Cardiovascular: Normal heart rate noted  Respiratory: Normal respiratory effort, no problems with respiration noted  Abdomen: Soft, gravid, appropriate for gestational age.        Pelvic: Cervical exam deferred        Extremities: Normal range of motion.     Mental Status: Normal mood and affect. Normal behavior. Normal judgment and thought content.   Assessment and Plan:  Pregnancy: G4P3003 at [redacted]w[redacted]d 1. Supervision of other normal pregnancy, antepartum Needs anatomy scan scheduled *** AFP next time  2. Unwanted fertility Sign tubal papers at 28w  3. GBS bacteriuria PCN in labor  Preterm labor symptoms and general obstetric precautions including but not limited to vaginal bleeding, contractions, leaking of fluid and fetal movement were reviewed in detail with the patient. Please refer to After Visit Summary for other counseling recommendations.   No follow-ups on file.  Future Appointments  Date Time Provider Department Center  08/10/2021 11:10 AM Milas Hock, MD [facility]    Milas Hock, MD

## 2021-08-10 ENCOUNTER — Encounter: Payer: Self-pay | Admitting: Obstetrics and Gynecology

## 2021-08-10 ENCOUNTER — Telehealth (INDEPENDENT_AMBULATORY_CARE_PROVIDER_SITE_OTHER): Payer: Medicaid Other | Admitting: Obstetrics and Gynecology

## 2021-08-10 VITALS — BP 106/69 | HR 86

## 2021-08-10 DIAGNOSIS — Z3480 Encounter for supervision of other normal pregnancy, unspecified trimester: Secondary | ICD-10-CM

## 2021-08-10 DIAGNOSIS — O99012 Anemia complicating pregnancy, second trimester: Secondary | ICD-10-CM

## 2021-08-10 DIAGNOSIS — R8271 Bacteriuria: Secondary | ICD-10-CM

## 2021-08-10 DIAGNOSIS — D500 Iron deficiency anemia secondary to blood loss (chronic): Secondary | ICD-10-CM

## 2021-08-10 DIAGNOSIS — O99820 Streptococcus B carrier state complicating pregnancy: Secondary | ICD-10-CM

## 2021-08-10 DIAGNOSIS — Z3009 Encounter for other general counseling and advice on contraception: Secondary | ICD-10-CM

## 2021-08-10 DIAGNOSIS — Z3A14 14 weeks gestation of pregnancy: Secondary | ICD-10-CM

## 2021-08-10 NOTE — Progress Notes (Signed)
Pt c/o feeling weak and feeling faint

## 2021-09-01 ENCOUNTER — Encounter: Payer: Self-pay | Admitting: Obstetrics and Gynecology

## 2021-09-13 NOTE — Progress Notes (Signed)
    PRENATAL VISIT NOTE  Subjective:  Amber Donovan is a 26 y.o. [phone] at [redacted]w[redacted]d being seen today for ongoing prenatal care.  She is currently monitored for the following issues for this low-risk pregnancy and has Gastroesophageal reflux disease; Supervision of other normal pregnancy, antepartum; Unwanted fertility; and GBS bacteriuria on their problem list.  Patient reports no complaints.  Contractions: Not present. Vag. Bleeding: None.  Movement: Present. Denies leaking of fluid.   The following portions of the patient's history were reviewed and updated as appropriate: allergies, current medications, past family history, past medical history, past social history, past surgical history and problem list.   Objective:   Vitals:   09/14/21 0817  BP: 106/71  Pulse: 82  Weight: 167 lb (75.8 kg)    Fetal Status: Fetal Heart Rate (bpm): 143   Movement: Present     General:  Alert, oriented and cooperative. Patient is in no acute distress.  Skin: Skin is warm and dry. No rash noted.   Cardiovascular: Normal heart rate noted  Respiratory: Normal respiratory effort, no problems with respiration noted  Abdomen: Soft, gravid, appropriate for gestational age.  Pain/Pressure: Present     Pelvic: Cervical exam deferred        Extremities: Normal range of motion.  Edema: None  Mental Status: Normal mood and affect. Normal behavior. Normal judgment and thought content.   Assessment and Plan:  Pregnancy: G4P3003 at [redacted]w[redacted]d 1. Supervision of other normal pregnancy, antepartum Anatomy scheduled for 9/18 - she will not be able to make this due to work. We will contact MFM office to see if they can help in anyway. Her work gets excessively busy starting next week and lasts for a couple months.  Offered and recommended AFP today - she accepts Flu shot given.  She will need next appt virtual and possibly the one after that.  She declines genetic testing for now, but if anything abnormal on anatomy  scan then she may elect to do NIPS.   2. Unwanted fertility Reviewed [facility] plan with patient - She would like tubal. Tubal papers signed.   3. GBS bacteriuria PCN in labor  Preterm labor symptoms and general obstetric precautions including but not limited to vaginal bleeding, contractions, leaking of fluid and fetal movement were reviewed in detail with the patient. Please refer to After Visit Summary for other counseling recommendations.   No follow-ups on file.  Future Appointments  Date Time Provider Department Center  09/25/2021  1:45 PM [facility]    Milas Hock, MD

## 2021-09-14 ENCOUNTER — Encounter: Payer: Self-pay | Admitting: Obstetrics and Gynecology

## 2021-09-14 ENCOUNTER — Ambulatory Visit (INDEPENDENT_AMBULATORY_CARE_PROVIDER_SITE_OTHER): Payer: Medicaid Other | Admitting: Obstetrics and Gynecology

## 2021-09-14 VITALS — BP 106/71 | HR 82 | Wt 167.0 lb

## 2021-09-14 DIAGNOSIS — Z3480 Encounter for supervision of other normal pregnancy, unspecified trimester: Secondary | ICD-10-CM

## 2021-09-14 DIAGNOSIS — R8271 Bacteriuria: Secondary | ICD-10-CM

## 2021-09-14 DIAGNOSIS — Z3482 Encounter for supervision of other normal pregnancy, second trimester: Secondary | ICD-10-CM | POA: Diagnosis not present

## 2021-09-14 DIAGNOSIS — Z23 Encounter for immunization: Secondary | ICD-10-CM | POA: Diagnosis not present

## 2021-09-14 DIAGNOSIS — Z3A19 19 weeks gestation of pregnancy: Secondary | ICD-10-CM | POA: Diagnosis not present

## 2021-09-14 DIAGNOSIS — Z3009 Encounter for other general counseling and advice on contraception: Secondary | ICD-10-CM

## 2021-09-14 NOTE — Addendum Note (Signed)
Addended by: Kathie Dike on: 09/14/2021 08:51 AM   Modules accepted: Orders

## 2021-09-14 NOTE — Progress Notes (Signed)
Pt declined Panorama

## 2021-09-15 ENCOUNTER — Ambulatory Visit: Payer: Medicaid Other | Attending: Obstetrics and Gynecology

## 2021-09-15 DIAGNOSIS — Z363 Encounter for antenatal screening for malformations: Secondary | ICD-10-CM | POA: Insufficient documentation

## 2021-09-15 DIAGNOSIS — Z3A20 20 weeks gestation of pregnancy: Secondary | ICD-10-CM | POA: Insufficient documentation

## 2021-09-15 DIAGNOSIS — Z3689 Encounter for other specified antenatal screening: Secondary | ICD-10-CM | POA: Insufficient documentation

## 2021-09-15 DIAGNOSIS — Z3480 Encounter for supervision of other normal pregnancy, unspecified trimester: Secondary | ICD-10-CM

## 2021-09-15 LAB — ALPHA FETOPROTEIN, MATERNAL
AFP MoM: 1.16
AFP, Serum: 69.7 ng/mL
Calc'd Gestational Age: 20.7 weeks
Maternal Wt: 167 [lb_av]
Risk for ONTD: 1
Twins-AFP: 1

## 2021-09-18 ENCOUNTER — Ambulatory Visit: Payer: Medicaid Other

## 2021-09-18 ENCOUNTER — Other Ambulatory Visit: Payer: Self-pay | Admitting: *Deleted

## 2021-09-18 DIAGNOSIS — Z362 Encounter for other antenatal screening follow-up: Secondary | ICD-10-CM

## 2021-09-25 ENCOUNTER — Other Ambulatory Visit: Payer: Medicaid Other

## 2021-10-11 ENCOUNTER — Telehealth: Payer: Self-pay | Admitting: *Deleted

## 2021-10-11 NOTE — Telephone Encounter (Signed)
Pt called about having a syncope episode where she felt like she was going to faint.  Per pt this has happened one other time.  She has a virtual visit on Friday and I tried to get her to make it in person but she said she could not.  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  I told her we could tell her what the problem was unless we see her in person.  She states that it is hard for her to get out  of work to come in.  She states that she is drinking at lest 64 oz of water daily and eating healthy.  Again I encouraged her to try and work out a time for in person visit.

## 2021-10-13 ENCOUNTER — Ambulatory Visit: Payer: Medicaid Other

## 2021-10-13 ENCOUNTER — Ambulatory Visit: Payer: Medicaid Other | Attending: Obstetrics and Gynecology

## 2021-10-13 ENCOUNTER — Telehealth (INDEPENDENT_AMBULATORY_CARE_PROVIDER_SITE_OTHER): Payer: Medicaid Other | Admitting: Obstetrics and Gynecology

## 2021-10-13 VITALS — BP 105/60

## 2021-10-13 DIAGNOSIS — R002 Palpitations: Secondary | ICD-10-CM | POA: Diagnosis not present

## 2021-10-13 DIAGNOSIS — Z3480 Encounter for supervision of other normal pregnancy, unspecified trimester: Secondary | ICD-10-CM

## 2021-10-13 DIAGNOSIS — R8271 Bacteriuria: Secondary | ICD-10-CM

## 2021-10-13 DIAGNOSIS — Z3A24 24 weeks gestation of pregnancy: Secondary | ICD-10-CM | POA: Diagnosis not present

## 2021-10-13 DIAGNOSIS — Z3482 Encounter for supervision of other normal pregnancy, second trimester: Secondary | ICD-10-CM

## 2021-10-13 NOTE — Progress Notes (Deleted)
   PRENATAL VISIT NOTE  Subjective:  Amber Donovan is a 26 y.o. G4P3003 at [redacted]w[redacted]d being seen today for ongoing prenatal care.  She is currently monitored for the following issues for this low-risk pregnancy and has Gastroesophageal reflux disease; Supervision of other normal pregnancy, antepartum; Unwanted fertility; and GBS bacteriuria on their problem list.  Patient reports heart palpitations. She reports this happening in every pregnancy, however this pregnancy the symptoms have been more often. She feels dizziness and faint, however has not had syncope.   Contractions: Not present. Vag. Bleeding: None.  Movement: Present. Denies leaking of fluid.   The following portions of the patient's history were reviewed and updated as appropriate: allergies, current medications, past family history, past medical history, past social history, past surgical history and problem list.   Objective:   Vitals:   10/13/21 0831  BP: 105/60    Fetal Status:     Movement: Present     General:  Alert, oriented and cooperative. Patient is in no acute distress.  Skin: Skin is warm and dry. No rash noted.   Cardiovascular: Normal heart rate noted  Respiratory: Normal respiratory effort, no problems with respiration noted  Abdomen: Soft, gravid, appropriate for gestational age.  Pain/Pressure: Present     Pelvic: {Blank single:19197::"Cervical exam performed in the presence of a chaperone","Cervical exam deferred"}        Extremities: Normal range of motion.  Edema: None  Mental Status: Normal mood and affect. Normal behavior. Normal judgment and thought content.   Assessment and Plan:  Pregnancy: G4P3003 at [redacted]w[redacted]d   1. Supervision of other normal pregnancy, antepartum   2. GBS bacteriuria   3. Heart palpitations  - Ambulatory referral to Cardiology     {Blank single:19197::"Term","Preterm"} labor symptoms and general obstetric precautions including but not limited to vaginal bleeding,  contractions, leaking of fluid and fetal movement were reviewed in detail with the patient. Please refer to After Visit Summary for other counseling recommendations.   No follow-ups on file.  Future Appointments  Date Time Provider [place]  10/13/2021  3:15 PM [facility] NURSE [facility]  10/13/2021  3:30 PM [facility] US2 [facility]  11/10/2021  8:10 AM Julianne Handler, CNM [facility]    Noni Saupe, NP

## 2021-10-13 NOTE — Progress Notes (Signed)
    TELEHEALTH OBSTETRICS VISIT ENCOUNTER NOTE  Provider location: [facility] [place] [facility] [place]   Patient location: Home  I connected with Amber Donovan on 10/13/21 at  8:30 AM EDT by telephone at home and verified that I am speaking with the correct person using two identifiers   I discussed the limitations, risks, security and privacy concerns of performing an evaluation and management service by telephone and the availability of in person appointments. I also discussed with the patient that there may be a patient responsible charge related to this service. The patient expressed understanding and agreed to proceed.  Subjective:  Amber Donovan is a 26 y.o. G4P3003 at 70w0dbeing followed for ongoing prenatal care.  She is currently monitored for the following issues for this low-risk pregnancy and has Gastroesophageal reflux disease; Supervision of other normal pregnancy, antepartum; Unwanted fertility; and GBS bacteriuria on their problem list.  Patient reports Patient reports heart palpitations. She reports this happening in every pregnancy, however this pregnancy the symptoms have been more often. She feels dizziness and faint, however has not had syncope.   Contractions: Not present. Vag. Bleeding: None.  Movement: Present. Reports fetal movement. Denies any contractions, bleeding or leaking of fluid.   The following portions of the patient's history were reviewed and updated as appropriate: allergies, current medications, past family history, past medical history, past social history, past surgical history and problem list.   Objective:  Blood pressure 105/60, last menstrual period 04/28/2021, unknown if currently breastfeeding. General:  Alert, oriented and cooperative.   Mental Status: Normal mood and affect perceived. Normal judgment and thought content.  Rest of physical exam deferred due to type of encounter  Assessment and Plan:  Pregnancy: G4P3003 at  261w0d. Supervision of other normal pregnancy, antepartum  - 2 hour GTT next visit.   2. GBS bacteriuria   3. Heart palpitations  - Increase protein snacks and more water.  - Ambulatory referral to Cardiology - Comp Met (CMET) - Thyroid Panel With TSH  Preterm labor symptoms and general obstetric precautions including but not limited to vaginal bleeding, contractions, leaking of fluid and fetal movement were reviewed in detail with the patient.  I discussed the assessment and treatment plan with the patient. The patient was provided an opportunity to ask questions and all were answered. The patient agreed with the plan and demonstrated an understanding of the instructions. The patient was advised to call back or seek an in-person office evaluation/[facility] any urgent or concerning symptoms. Please refer to After Visit Summary for other counseling recommendations.   I provided 15 minutes of non-face-to-face time during this encounter.  Return in about 4 weeks (around 11/10/2021), or Inperson for 2 hour GTT testing and BTL sign.  Future Appointments  Date Time Provider [place]10/06/2021  3:15 PM [facility]/06/2021  3:30 PM [facility]21  8:10 AM BhJulianne HandlerCNM [facility]    JeNoni SaupeNP [facility]

## 2021-10-15 DIAGNOSIS — L299 Pruritus, unspecified: Secondary | ICD-10-CM | POA: Diagnosis not present

## 2021-11-02 ENCOUNTER — Ambulatory Visit (INDEPENDENT_AMBULATORY_CARE_PROVIDER_SITE_OTHER): Payer: Medicaid Other | Admitting: Obstetrics and Gynecology

## 2021-11-02 ENCOUNTER — Encounter: Payer: Self-pay | Admitting: Obstetrics and Gynecology

## 2021-11-02 VITALS — BP 106/70 | HR 90 | Wt 176.0 lb

## 2021-11-02 DIAGNOSIS — Z3009 Encounter for other general counseling and advice on contraception: Secondary | ICD-10-CM

## 2021-11-02 DIAGNOSIS — Z3480 Encounter for supervision of other normal pregnancy, unspecified trimester: Secondary | ICD-10-CM

## 2021-11-02 DIAGNOSIS — R8271 Bacteriuria: Secondary | ICD-10-CM

## 2021-11-02 DIAGNOSIS — Z23 Encounter for immunization: Secondary | ICD-10-CM | POA: Diagnosis not present

## 2021-11-02 DIAGNOSIS — Z3A26 26 weeks gestation of pregnancy: Secondary | ICD-10-CM

## 2021-11-02 DIAGNOSIS — Z3482 Encounter for supervision of other normal pregnancy, second trimester: Secondary | ICD-10-CM

## 2021-11-02 NOTE — Progress Notes (Signed)
   PRENATAL VISIT NOTE  Subjective:  Amber Donovan is a 26 y.o. G4P3003 at [redacted]w[redacted]d being seen today for ongoing prenatal care.  She is currently monitored for the following issues for this low-risk pregnancy and has Gastroesophageal reflux disease; Supervision of other normal pregnancy, antepartum; Unwanted fertility; and GBS bacteriuria on their problem list.  Patient reports no complaints.  Contractions: Not present. Vag. Bleeding: None.  Movement: Present. Denies leaking of fluid.   The following portions of the patient's history were reviewed and updated as appropriate: allergies, current medications, past family history, past medical history, past social history, past surgical history and problem list.   Objective:   Vitals:   11/02/21 1047  BP: 106/70  Pulse: 90  Weight: 176 lb (79.8 kg)    Fetal Status: Fetal Heart Rate (bpm): 146 Fundal Height: 28 cm Movement: Present     General:  Alert, oriented and cooperative. Patient is in no acute distress.  Skin: Skin is warm and dry. No rash noted.   Cardiovascular: Normal heart rate noted  Respiratory: Normal respiratory effort, no problems with respiration noted  Abdomen: Soft, gravid, appropriate for gestational age.  Pain/Pressure: Present     Pelvic: Cervical exam deferred        Extremities: Normal range of motion.     Mental Status: Normal mood and affect. Normal behavior. Normal judgment and thought content.   Assessment and Plan:  Pregnancy: G4P3003 at [redacted]w[redacted]d 1. Immunization due - 2Hr GTT w/ 1 Hr Carpenter 75 g - HIV antibody (with reflex) - CBC - RPR  2. Supervision of other normal pregnancy, antepartum Patient is doing well without complaints Patient had breakfast and will return tomorrow for third trimester labs with glucola Patient scheduled for [facility] cardiology for evaluation of palpitation Follow up ultrasound ordered due to incomplete anatomy  3. GBS bacteriuria Prophylaxis in labor  4. Unwanted  fertility Papers previously signed  Preterm labor symptoms and general obstetric precautions including but not limited to vaginal bleeding, contractions, leaking of fluid and fetal movement were reviewed in detail with the patient. Please refer to After Visit Summary for other counseling recommendations.   Return in about 2 weeks (around 11/16/2021) for virtual or in person (patient choice), ROB, Low risk.  Future Appointments  Date Time Provider [place]  11/03/2021  3:00 PM Berniece Salines, DO [facility] None  11/20/2021  8:30 AM Gala Romney, Fredderick Phenix, MD [facility]    Mora Bellman, MD

## 2021-11-03 ENCOUNTER — Other Ambulatory Visit (INDEPENDENT_AMBULATORY_CARE_PROVIDER_SITE_OTHER): Payer: Medicaid Other

## 2021-11-03 ENCOUNTER — Encounter: Payer: Self-pay | Admitting: Cardiology

## 2021-11-03 ENCOUNTER — Ambulatory Visit (INDEPENDENT_AMBULATORY_CARE_PROVIDER_SITE_OTHER): Payer: Medicaid Other | Admitting: Cardiology

## 2021-11-03 VITALS — BP 112/70 | HR 105 | Ht 64.0 in | Wt 176.6 lb

## 2021-11-03 DIAGNOSIS — R002 Palpitations: Secondary | ICD-10-CM | POA: Diagnosis not present

## 2021-11-03 DIAGNOSIS — R0609 Other forms of dyspnea: Secondary | ICD-10-CM

## 2021-11-03 NOTE — Patient Instructions (Addendum)
Medication Instructions:  Your physician recommends that you continue on your current medications as directed. Please refer to the Current Medication list given to you today.  *If you need a refill on your cardiac medications before your next appointment, please call your pharmacy*   Lab Work: NONE If you have labs (blood work) drawn today and your tests are completely normal, you will receive your results only by: [place] (if you have MyChart) OR A paper copy in the mail If you have any lab test that is abnormal or we need to change your treatment, we will call you to review the results.   Testing/Procedures: Your physician has requested that you have an echocardiogram. Echocardiography is a painless test that uses sound waves to create images of your heart. It provides your doctor with information about the size and shape of your heart and how well your heart's chambers and valves are working. This procedure takes approximately one hour. There are no restrictions for this procedure. Please do NOT wear cologne, perfume, aftershave, or lotions (deodorant is allowed). Please arrive 15 minutes prior to your appointment time.   ZIO AT Long term monitor-Live Telemetry  Your physician has requested you wear a ZIO patch monitor for 7 days.  This is a single patch monitor. [employer] supplies one patch monitor per enrollment. Additional  stickers are not available.  Please do not apply patch if you will be having a Nuclear Stress Test, Echocardiogram, Cardiac CT, MRI,  or Chest Xray during the period you would be wearing the monitor. The patch cannot be worn during  these tests. You cannot remove and re-apply the ZIO AT patch monitor.  Your ZIO patch monitor will be mailed 3 day USPS to your address on file. It may take 3-5 days to  receive your monitor after you have been enrolled.  Once you have received your monitor, please review the enclosed instructions. Your monitor has  already  been registered assigning a specific monitor serial # to you.   Billing and Patient Assistance Program information  Theodore Demark has been supplied with any insurance information on record for billing. [employer] offers a sliding scale Patient Assistance Program for patients without insurance, or whose  insurance does not completely cover the cost of the ZIO patch monitor. You must apply for the  Patient Assistance Program to qualify for the discounted rate. To apply, call [employer] at [phone],  select option 4, select option 2 , ask to apply for the Patient Assistance Program, (you can request an  interpreter if needed). [employer] will ask your household income and how many people are in your  household. [employer] will quote your out-of-pocket cost based on this information. They will also be able  to set up a 12 month interest free payment plan if needed.  Applying the monitor   Shave hair from upper left chest.  Hold the abrader disc by orange tab. Rub the abrader in 40 strokes over left upper chest as indicated in  your monitor instructions.  Clean area with 4 enclosed alcohol pads. Use all pads to ensure the area is cleaned thoroughly. Let  dry.  Apply patch as indicated in monitor instructions. Patch will be placed under collarbone on left side of  chest with arrow pointing upward.  Rub patch adhesive wings for 2 minutes. Remove the white label marked "1". Remove the white label  marked "2". Rub patch adhesive wings for 2 additional minutes.  While looking in a mirror, press and release  button in center of patch. A small green light will flash 3-4  times. This will be your only indicator that the monitor has been turned on.  Do not shower for the first 24 hours. You may shower after the first 24 hours.  Press the button if you feel a symptom. You will hear a small click. Record Date, Time and Symptom in  the Patient Log.   Starting the Gateway  In your kit there is a Hydrographic surveyor box the  size of a cellphone. This is Airline pilot. It transmits all your  recorded data to [facility]. This box must always stay within 10 feet of you. Open the box and push the *  button. There will be a light that blinks orange and then green a few times. When the light stops  blinking, the Gateway is connected to the ZIO patch. Call [employer] at [phone] to confirm your monitor is transmitting.  Returning your monitor  Remove your patch and place it inside the [place]. In the lower half of the Gateway there is a white  bag with prepaid postage on it. Place Gateway in bag and seal. Mail package back to [place] as soon as  possible. Your physician should have your final report approximately 7 days after you have mailed back  your monitor. Call [place] at [phone] if you have questions regarding your ZIO AT  patch monitor. Call them immediately if you see an orange light blinking on your monitor.  If your monitor falls off in less than 4 days, contact our Monitor department at [phone]. If your  monitor becomes loose or falls off after 4 days call [employer] at [phone] for suggestions on    Follow-Up: At [facility], you and your health needs are our priority.  As part of our continuing mission to provide you with exceptional heart care, we have created designated Provider Care Teams.  These Care Teams include your primary Cardiologist (physician) and Advanced Practice Providers (APPs -  Physician Assistants and Nurse Practitioners) who all work together to provide you with the care you need, when you need it.  We recommend signing up for the patient portal called "MyChart".  Sign up information is provided on this After Visit Summary.  MyChart is used to connect with patients for Virtual Visits (Telemedicine).  Patients are able to view lab/test results, encounter notes, upcoming appointments, etc.  Non-urgent messages can be sent to your provider as  well.   To learn more about what you can do with MyChart, go to [email or web address].    Your next appointment:   10 week(s) IT IS OKAY TO DOUBLE BOOK   The format for your next appointment:   In Person  Provider:   Berniece Salines, DO

## 2021-11-03 NOTE — Progress Notes (Signed)
Cardiology Office Note:    Date:  11/03/2021   ID:  Amber Donovan, DOB [birth year], MRN [id number]  PCP:  Patient, No Pcp Per  Cardiologist:  Berniece Salines, DO  Electrophysiologist:  None   Referring MD: Lezlie Lye, NP   " I am having palpitations"  History of Present Illness:    Amber Donovan is a 26 y.o. female with a hx of Asthma and GERD here to be evaluated for palpitations.   She tells me that as a child and young adult she has had intermittent palpitations. She notes in the past she would have brief episodes but recently its frequent and she has associated lightheadedness and shortness of breath.    Past Medical History:  Diagnosis Date   Asthma    GERD (gastroesophageal reflux disease)     Past Surgical History:  Procedure Laterality Date   CHOLECYSTECTOMY     WISDOM TOOTH EXTRACTION      Current Medications: Current Meds  Medication Sig   Prenatal Vit-Fe Fumarate-FA (PRENATAL MULTIVITAMIN) TABS tablet Take 1 tablet by mouth daily at 12 noon.     Allergies:   Hydrocodone, Hydrocodone-acetaminophen, and Sunflower oil   Social History   Socioeconomic History   Marital status: Married    Spouse name: Not on file   Number of children: Not on file   Years of education: Not on file   Highest education level: Not on file  Occupational History   Occupation: truck driver  Tobacco Use   Smoking status: Never   Smokeless tobacco: Never  Vaping Use   Vaping Use: Never used  Substance and Sexual Activity   Alcohol use: Never   Drug use: Never   Sexual activity: Yes    Partners: Male    Birth control/protection: None  Other Topics Concern   Not on file  Social History Narrative   Not on file   Social Determinants of Health   Financial Resource Strain: Not on file  Food Insecurity: Not on file  Transportation Needs: Not on file  Physical Activity: Not on file  Stress: Not on file  Social Connections: Not on file     Family History: The  patient's family history includes Diabetes in her father, mother, and paternal grandmother; Hodgkin's lymphoma in her paternal grandfather; Prostate cancer in her maternal grandfather.  ROS:   Review of Systems  Constitution: Negative for decreased appetite, fever and weight gain.  HENT: Negative for congestion, ear discharge, hoarse voice and sore throat.   Eyes: Negative for discharge, redness, vision loss in right eye and visual halos.  Cardiovascular: Negative for chest pain, dyspnea on exertion, leg swelling, orthopnea and palpitations.  Respiratory: Negative for cough, hemoptysis, shortness of breath and snoring.   Endocrine: Negative for heat intolerance and polyphagia.  Hematologic/Lymphatic: Negative for bleeding problem. Does not bruise/bleed easily.  Skin: Negative for flushing, nail changes, rash and suspicious lesions.  Musculoskeletal: Negative for arthritis, joint pain, muscle cramps, myalgias, neck pain and stiffness.  Gastrointestinal: Negative for abdominal pain, bowel incontinence, diarrhea and excessive appetite.  Genitourinary: Negative for decreased libido, genital sores and incomplete emptying.  Neurological: Negative for brief paralysis, focal weakness, headaches and loss of balance.  Psychiatric/Behavioral: Negative for altered mental status, depression and suicidal ideas.  Allergic/Immunologic: Negative for HIV exposure and persistent infections.    EKGs/Labs/Other Studies Reviewed:    The following studies were reviewed today:   EKG:  The ekg ordered today demonstrates   Recent Labs: 07/13/2021: Hemoglobin 12.4;  Platelets 169; TSH 1.01  Recent Lipid Panel No results found for: "CHOL", "TRIG", "HDL", "CHOLHDL", "VLDL", "LDLCALC", "LDLDIRECT"  Physical Exam:    VS:  BP 112/70   Pulse (!) 105   Ht _0  (1.626 m)   Wt 176 lb 9.6 oz (80.1 kg)   LMP 04/28/2021   SpO2 97%   BMI 30.31 kg/m     Wt Readings from Last 3 Encounters:  11/03/21 176 lb 9.6 oz  (80.1 kg)  11/02/21 176 lb (79.8 kg)  09/14/21 167 lb (75.8 kg)     GEN: Well nourished, well developed in no acute distress HEENT: Normal NECK: No JVD; No carotid bruits LYMPHATICS: No lymphadenopathy CARDIAC: S1S2 noted,RRR, no murmurs, rubs, gallops RESPIRATORY:  Clear to auscultation without rales, wheezing or rhonchi  ABDOMEN: Soft, non-tender, non-distended, +bowel sounds, no guarding. EXTREMITIES: No edema, No cyanosis, no clubbing MUSCULOSKELETAL:  No deformity  SKIN: Warm and dry NEUROLOGIC:  Alert and oriented x 3, non-focal PSYCHIATRIC:  Normal affect, good insight  ASSESSMENT:    1. Palpitations   2. DOE (dyspnea on exertion)    PLAN:    I would like to rule out a cardiovascular etiology of this palpitation, therefore at this time I would like to placed a zio patch for 7  days. In additon  for her shortness of breath, a transthoracic echocardiogram will be ordered to assess LV/RV function and any structural abnormalities. Once these testing have been performed amd reviewed further reccomendations will be made. For now, I do reccomend that the patient goes to the nearest ED if  symptoms recur.   The patient is in agreement with the above plan. The patient left the office in stable condition.  The patient will follow up in 10 weeks   Medication Adjustments/Labs and Tests Ordered: Current medicines are reviewed at length with the patient today.  Concerns regarding medicines are outlined above.  Orders Placed This Encounter  Procedures   LONG TERM MONITOR-LIVE TELEMETRY (3-14 DAYS)   EKG 12-Lead   ECHOCARDIOGRAM COMPLETE   No orders of the defined types were placed in this encounter.   Patient Instructions  Medication Instructions:  Your physician recommends that you continue on your current medications as directed. Please refer to the Current Medication list given to you today.  *If you need a refill on your cardiac medications before your next appointment, please  call your pharmacy*   Lab Work: NONE If you have labs (blood work) drawn today and your tests are completely normal, you will receive your results only by: [place] (if you have MyChart) OR A paper copy in the mail If you have any lab test that is abnormal or we need to change your treatment, we will call you to review the results.   Testing/Procedures: Your physician has requested that you have an echocardiogram. Echocardiography is a painless test that uses sound waves to create images of your heart. It provides your doctor with information about the size and shape of your heart and how well your heart's chambers and valves are working. This procedure takes approximately one hour. There are no restrictions for this procedure. Please do NOT wear cologne, perfume, aftershave, or lotions (deodorant is allowed). Please arrive 15 minutes prior to your appointment time.   ZIO AT Long term monitor-Live Telemetry  Your physician has requested you wear a ZIO patch monitor for 7 days.  This is a single patch monitor. [employer] supplies one patch monitor per enrollment. Additional  stickers  are not available.  Please do not apply patch if you will be having a Nuclear Stress Test, Echocardiogram, Cardiac CT, MRI,  or Chest Xray during the period you would be wearing the monitor. The patch cannot be worn during  these tests. You cannot remove and re-apply the ZIO AT patch monitor.  Your ZIO patch monitor will be mailed 3 day USPS to your address on file. It may take 3-5 days to  receive your monitor after you have been enrolled.  Once you have received your monitor, please review the enclosed instructions. Your monitor has  already been registered assigning a specific monitor serial # to you.   Billing and Patient Assistance Program information  Theodore Demark has been supplied with any insurance information on record for billing. [employer] offers a sliding scale Patient Assistance Program for  patients without insurance, or whose  insurance does not completely cover the cost of the ZIO patch monitor. You must apply for the  Patient Assistance Program to qualify for the discounted rate. To apply, call [employer] at [phone],  select option 4, select option 2 , ask to apply for the Patient Assistance Program, (you can request an  interpreter if needed). [employer] will ask your household income and how many people are in your  household. [employer] will quote your out-of-pocket cost based on this information. They will also be able  to set up a 12 month interest free payment plan if needed.  Applying the monitor   Shave hair from upper left chest.  Hold the abrader disc by orange tab. Rub the abrader in 40 strokes over left upper chest as indicated in  your monitor instructions.  Clean area with 4 enclosed alcohol pads. Use all pads to ensure the area is cleaned thoroughly. Let  dry.  Apply patch as indicated in monitor instructions. Patch will be placed under collarbone on left side of  chest with arrow pointing upward.  Rub patch adhesive wings for 2 minutes. Remove the white label marked "1". Remove the white label  marked "2". Rub patch adhesive wings for 2 additional minutes.  While looking in a mirror, press and release button in center of patch. A small green light will flash 3-4  times. This will be your only indicator that the monitor has been turned on.  Do not shower for the first 24 hours. You may shower after the first 24 hours.  Press the button if you feel a symptom. You will hear a small click. Record Date, Time and Symptom in  the Patient Log.   Starting the Gateway  In your kit there is a Hydrographic surveyor box the size of a cellphone. This is Airline pilot. It transmits all your  recorded data to [facility]. This box must always stay within 10 feet of you. Open the box and push the *  button. There will be a light that blinks orange and then green a few times. When the  light stops  blinking, the Gateway is connected to the ZIO patch. Call [employer] at [phone] to confirm your monitor is transmitting.  Returning your monitor  Remove your patch and place it inside the [place]. In the lower half of the Gateway there is a white  bag with prepaid postage on it. Place Gateway in bag and seal. Mail package back to [place] as soon as  possible. Your physician should have your final report approximately 7 days after you have mailed back  your monitor. Call [facility]  at [phone] if you have questions regarding your ZIO AT  patch monitor. Call them immediately if you see an orange light blinking on your monitor.  If your monitor falls off in less than 4 days, contact our Monitor department at [phone]. If your  monitor becomes loose or falls off after 4 days call [employer] at [phone] for suggestions on    Follow-Up: At [facility], you and your health needs are our priority.  As part of our continuing mission to provide you with exceptional heart care, we have created designated Provider Care Teams.  These Care Teams include your primary Cardiologist (physician) and Advanced Practice Providers (APPs -  Physician Assistants and Nurse Practitioners) who all work together to provide you with the care you need, when you need it.  We recommend signing up for the patient portal called "MyChart".  Sign up information is provided on this After Visit Summary.  MyChart is used to connect with patients for Virtual Visits (Telemedicine).  Patients are able to view lab/test results, encounter notes, upcoming appointments, etc.  Non-urgent messages can be sent to your provider as well.   To learn more about what you can do with MyChart, go to [email or web address].    Your next appointment:   10 week(s) IT IS OKAY TO DOUBLE BOOK   The format for your next appointment:   In Person  Provider:   Berniece Salines, DO      Adopting  a Healthy Lifestyle.  Know what a healthy weight is for you (roughly BMI <25) and aim to maintain this   Aim for 7+ servings of fruits and vegetables daily   65-80+ fluid ounces of water or unsweet tea for healthy kidneys   Limit to max 1 drink of alcohol per day; avoid smoking/tobacco   Limit animal fats in diet for cholesterol and heart health - choose grass fed whenever available   Avoid highly processed foods, and foods high in saturated/trans fats   Aim for low stress - take time to unwind and care for your mental health   Aim for 150 min of moderate intensity exercise weekly for heart health, and weights twice weekly for bone health   Aim for 7-9 hours of sleep daily   When it comes to diets, agreement about the perfect plan isnt easy to find, even among the experts. Experts at the [employer] developed an idea known as the Healthy Eating Plate. Just imagine a plate divided into logical, healthy portions.   The emphasis is on diet quality:   Load up on vegetables and fruits - one-half of your plate: Aim for color and variety, and remember that potatoes dont count.   Go for whole grains - one-quarter of your plate: Whole wheat, barley, wheat berries, quinoa, oats, brown rice, and foods made with them. If you want pasta, go with whole wheat pasta.   Protein power - one-quarter of your plate: Fish, chicken, beans, and nuts are all healthy, versatile protein sources. Limit red meat.   The diet, however, does go beyond the plate, offering a few other suggestions.   Use healthy plant oils, such as olive, canola, soy, corn, sunflower and peanut. Check the labels, and avoid partially hydrogenated oil, which have unhealthy trans fats.   If youre thirsty, drink water. Coffee and tea are good in moderation, but skip sugary drinks and limit milk and dairy products to one or two daily servings.   The type of carbohydrate in  the diet is more important than the  amount. Some sources of carbohydrates, such as vegetables, fruits, whole grains, and beans-are healthier than others.   Finally, stay active  Signed, Berniece Salines, DO  11/03/2021 3:59 PM    [facility]

## 2021-11-10 ENCOUNTER — Encounter: Payer: Medicaid Other | Admitting: Certified Nurse Midwife

## 2021-11-13 ENCOUNTER — Encounter: Payer: Medicaid Other | Admitting: Obstetrics & Gynecology

## 2021-11-13 ENCOUNTER — Ambulatory Visit: Payer: Medicaid Other | Admitting: Cardiology

## 2021-11-17 ENCOUNTER — Other Ambulatory Visit (HOSPITAL_COMMUNITY): Payer: Medicaid Other

## 2021-11-17 ENCOUNTER — Encounter (HOSPITAL_COMMUNITY): Payer: Self-pay

## 2021-11-17 ENCOUNTER — Telehealth (HOSPITAL_COMMUNITY): Payer: Self-pay | Admitting: Cardiology

## 2021-11-17 NOTE — Telephone Encounter (Signed)
Patient called and cancelled echocardiogram same day 45 minutes before appt. Patient states she will call back at a later date to reschedule. Order will be removed form the active echo WQ and when she calls back we can reinstate the order. Thank you.

## 2021-11-20 ENCOUNTER — Telehealth (INDEPENDENT_AMBULATORY_CARE_PROVIDER_SITE_OTHER): Payer: Medicaid Other | Admitting: Obstetrics & Gynecology

## 2021-11-20 ENCOUNTER — Encounter: Payer: Self-pay | Admitting: Obstetrics & Gynecology

## 2021-11-20 DIAGNOSIS — R002 Palpitations: Secondary | ICD-10-CM

## 2021-11-20 DIAGNOSIS — I8390 Asymptomatic varicose veins of unspecified lower extremity: Secondary | ICD-10-CM

## 2021-11-20 DIAGNOSIS — R8271 Bacteriuria: Secondary | ICD-10-CM

## 2021-11-20 DIAGNOSIS — Z3483 Encounter for supervision of other normal pregnancy, third trimester: Secondary | ICD-10-CM

## 2021-11-20 DIAGNOSIS — Z3A29 29 weeks gestation of pregnancy: Secondary | ICD-10-CM

## 2021-11-20 DIAGNOSIS — Z3480 Encounter for supervision of other normal pregnancy, unspecified trimester: Secondary | ICD-10-CM

## 2021-11-20 NOTE — Progress Notes (Signed)
   OBSTETRICS PRENATAL VIRTUAL VISIT ENCOUNTER NOTE  Provider location: Center for [facility] [place]   Patient location: Home  I connected with Amber Donovan on 11/20/21 at  8:30 AM EST by MyChart Video Encounter and verified that I am speaking with the correct person using two identifiers. I discussed the limitations, risks, security and privacy concerns of performing an evaluation and management service virtually and the availability of in person appointments. I also discussed with the patient that there may be a patient responsible charge related to this service. The patient expressed understanding and agreed to proceed. Subjective:  Amber Donovan is a 26 y.o. [phone] at [redacted]w[redacted]d being seen today for ongoing prenatal care.  She is currently monitored for the following issues for this low-risk pregnancy and has Gastroesophageal reflux disease; Supervision of other normal pregnancy, antepartum; Unwanted fertility; and GBS bacteriuria on their problem list.  Patient reports  SOB,palpitations--cancelled echo .  Contractions: Not present.  .  Movement: Present. Denies any leaking of fluid.   The following portions of the patient's history were reviewed and updated as appropriate: allergies, current medications, past family history, past medical history, past social history, past surgical history and problem list.   Objective:   Vitals:   11/20/21 0734  BP: 108/70  Pulse: 88  Weight: 178 lb (80.7 kg)    Fetal Status:     Movement: Present     General:  Alert, oriented and cooperative. Patient is in no acute distress.  Respiratory: Normal respiratory effort, no problems with respiration noted  Mental Status: Normal mood and affect. Normal behavior. Normal judgment and thought content.  Rest of physical exam deferred due to type of encounter  Imaging: No results found.  Assessment and Plan:  Pregnancy: G4P3003 at [redacted]w[redacted]d 1. Supervision of other normal pregnancy,  antepartum Needs f/u US for anatomy; Needs 28 week labs, Need TSH.  Pt would like a 1-hour test due to lack of time to come to helath care appts.   2. GBS bacteriuria Rx in labor  3.  SOB and palpitations Still needs echo--canceled b/c she was overwhelmed--she thought the exam took 2-3 hours and she could not stay for that long.  Conception understands that the exam is 30 to 45 minutes.  4.  Feeling overwhelmed RN to administer GAD and PHQ-9  5.  Spider veins / 1 varicose vein Pt is going to buy compression stockings this pay day  Preterm labor symptoms and general obstetric precautions including but not limited to vaginal bleeding, contractions, leaking of fluid and fetal movement were reviewed in detail with the patient. I discussed the assessment and treatment plan with the patient. The patient was provided an opportunity to ask questions and all were answered. The patient agreed with the plan and demonstrated an understanding of the instructions. The patient was advised to call back or seek an in-person office evaluation/go to MAU at [facility] for any urgent or concerning symptoms. Please refer to After Visit Summary for other counseling recommendations.   I provided 20 minutes of face-to-face time during this encounter.  No follow-ups on file.  Future Appointments  Date Time Provider Department Center  11/20/2021  8:30 AM Lesly Dukes, MD [facility]  11/27/2021  3:15 PM [facility] NURSE [facility]  11/27/2021  3:30 PM [facility] US3 [facility]  01/26/2022  1:40 PM Tobb, Lavona Mound, DO [facility] None    Elsie Lincoln, MD Center for [facility]

## 2021-11-20 NOTE — Progress Notes (Signed)
Phq-1 Gad-2

## 2021-11-20 NOTE — Progress Notes (Signed)
Pt states she forgets to put BP into Babyscripts. Pt was encouraged to take BP weekly and enter it into app. Pt does not have BP cuff with her to take BP today.

## 2021-11-27 ENCOUNTER — Ambulatory Visit: Payer: Medicaid Other

## 2021-12-15 ENCOUNTER — Ambulatory Visit: Payer: Medicaid Other

## 2021-12-18 ENCOUNTER — Encounter: Payer: Medicaid Other | Admitting: Obstetrics and Gynecology

## 2021-12-21 ENCOUNTER — Encounter: Payer: Medicaid Other | Admitting: Obstetrics & Gynecology

## 2021-12-22 ENCOUNTER — Encounter: Payer: Self-pay | Admitting: Obstetrics and Gynecology

## 2021-12-24 ENCOUNTER — Encounter: Payer: Self-pay | Admitting: Obstetrics and Gynecology

## 2021-12-25 ENCOUNTER — Ambulatory Visit (INDEPENDENT_AMBULATORY_CARE_PROVIDER_SITE_OTHER): Payer: Medicaid Other | Admitting: Obstetrics and Gynecology

## 2021-12-25 VITALS — BP 120/70 | HR 88 | Wt 186.0 lb

## 2021-12-25 DIAGNOSIS — Z3480 Encounter for supervision of other normal pregnancy, unspecified trimester: Secondary | ICD-10-CM

## 2021-12-25 DIAGNOSIS — Z3483 Encounter for supervision of other normal pregnancy, third trimester: Secondary | ICD-10-CM

## 2021-12-25 DIAGNOSIS — R002 Palpitations: Secondary | ICD-10-CM

## 2021-12-25 DIAGNOSIS — Z3A34 34 weeks gestation of pregnancy: Secondary | ICD-10-CM | POA: Diagnosis not present

## 2021-12-25 DIAGNOSIS — R202 Paresthesia of skin: Secondary | ICD-10-CM

## 2021-12-25 DIAGNOSIS — M792 Neuralgia and neuritis, unspecified: Secondary | ICD-10-CM

## 2021-12-25 NOTE — Progress Notes (Signed)
Pt stated that she had pain on back side of Rt leg from thigh downward yesterday but nothing today.

## 2021-12-25 NOTE — Progress Notes (Signed)
   PRENATAL VISIT NOTE  Subjective:  Amber Donovan is a 26 y.o. [phone] at [redacted]w[redacted]d being seen today for ongoing prenatal care.  She is currently monitored for the following issues for this low-risk pregnancy and has Gastroesophageal reflux disease; Supervision of other normal pregnancy, antepartum; Unwanted fertility; and GBS bacteriuria on their problem list.  Patient reports full body nerve pain, paresthesias and severe fatigue. Progressively worsening throughout pregnancy. Feels completely drained at the end of the day and has shooting/burning pain throughout her entire body from her head to her feet. Has tingling in her hands. Takes ibuprofen when pain is most severe since tylenol is not helpful. Notes pain is affecting her mood and making her irritable. 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 Continues to have intermittent palpitations.  Contractions: Irritability. Vag. Bleeding: None.  Movement: Present. Denies leaking of fluid.   The following portions of the patient's history were reviewed and updated as appropriate: allergies, current medications, past family history, past medical history, past social history, past surgical history and problem list.   Objective:   Vitals:   12/25/21 0903  BP: 120/70  Pulse: 88  Weight: 186 lb (84.4 kg)   Fetal Status: Fetal Heart Rate (bpm): 136 Fundal Height: 34 cm Movement: Present     General:  Alert, oriented and cooperative. Patient is in no acute distress.  Skin: Skin is warm and dry. No rash noted.   Cardiovascular: Normal heart rate noted  Respiratory: Normal respiratory effort, no problems with respiration noted  Abdomen: Soft, gravid, appropriate for gestational age.  Pain/Pressure: Present      Assessment and Plan:  Pregnancy: G4P3003 at [redacted]w[redacted]d  1. Supervision of other normal pregnancy, antepartum - HIV antibody (with reflex) - CBC - RPR - HgB A1c - declines 1h today,  considering for next appt  2. Neuropathic pain, paresthesias, and fatigue TSH, B12/folate, CMP, CBC Discussed ddx and management strategies. Discussed role for topical agents, heat/ice or medications like flexeril, gabapentin, SNRIs. Offered BH referral for mood.  Discouraged ibuprofen use, reviewed risks After discussion, she would like to await lab results and use OTC topical medications (e.g. lidocaine patches, essential oils). Declines BH.  3. Palpitations Normal ambulatory monitoring TSH, CMP, B12 as above Was recommended for echo & cardiology follow up, but has not been able to schedule. Offered assistance, but she would like to reach out on her own  Preterm labor symptoms and general obstetric precautions including but not limited to vaginal bleeding, contractions, leaking of fluid and fetal movement were reviewed in detail with the patient. Please refer to After Visit Summary for other counseling recommendations.   Return in about 2 years (around 12/26/2023).  Future Appointments  Date Time Provider Department Center  12/29/2021  3:30 PM [facility] NURSE [facility]  12/29/2021  3:45 PM [facility] US6 [facility]  01/09/2022  8:10 AM Donette Larry, CNM [facility]  01/26/2022  1:40 PM Tobb, Lavona Mound, DO [facility] None    Lennart Pall, MD

## 2021-12-26 LAB — COMPREHENSIVE METABOLIC PANEL
ALT: 10 IU/L (ref 0–32)
AST: 17 IU/L (ref 0–40)
Albumin/Globulin Ratio: 1.3 (ref 1.2–2.2)
Albumin: 3.5 g/dL — ABNORMAL LOW (ref 4.0–5.0)
Alkaline Phosphatase: 143 IU/L — ABNORMAL HIGH (ref 44–121)
BUN/Creatinine Ratio: 8 — ABNORMAL LOW (ref 9–23)
BUN: 5 mg/dL — ABNORMAL LOW (ref 6–20)
Bilirubin Total: <0.2 mg/dL (ref 0.0–1.2)
CO2: 20 mmol/L (ref 20–29)
Calcium: 8.6 mg/dL — ABNORMAL LOW (ref 8.7–10.2)
Chloride: 106 mmol/L (ref 96–106)
Creatinine, Ser: 0.61 mg/dL (ref 0.57–1.00)
Globulin, Total: 2.6 g/dL (ref 1.5–4.5)
Glucose: 65 mg/dL — ABNORMAL LOW (ref 70–99)
Potassium: 3.9 mmol/L (ref 3.5–5.2)
Sodium: 140 mmol/L (ref 134–144)
Total Protein: 6.1 g/dL (ref 6.0–8.5)
eGFR: 126 mL/min/{1.73_m2} (ref >59)

## 2021-12-26 LAB — CBC
Hematocrit: 34.7 % (ref 34.0–46.6)
Hemoglobin: 11.4 g/dL (ref 11.1–15.9)
MCH: 28.3 pg (ref 26.6–33.0)
MCHC: 32.9 g/dL (ref 31.5–35.7)
MCV: 86 fL (ref 79–97)
Platelets: 237 10*3/uL (ref 150–450)
RBC: 4.03 x10E6/uL (ref 3.77–5.28)
RDW: 11.9 % (ref 11.7–15.4)
WBC: 12.3 10*3/uL — ABNORMAL HIGH (ref 3.4–10.8)

## 2021-12-26 LAB — HEMOGLOBIN A1C
Est. average glucose Bld gHb Est-mCnc: 111 mg/dL
Hgb A1c MFr Bld: 5.5 % (ref 4.8–5.6)

## 2021-12-26 LAB — TSH: TSH: 1.76 u[IU]/mL (ref 0.450–4.500)

## 2021-12-26 LAB — HIV ANTIBODY (ROUTINE TESTING W REFLEX): HIV Screen 4th Generation wRfx: NONREACTIVE

## 2021-12-26 LAB — B12 AND FOLATE PANEL
Folate: 15.5 ng/mL (ref >3.0)
Vitamin B-12: 570 pg/mL (ref 232–1245)

## 2021-12-26 LAB — RPR: RPR Ser Ql: NONREACTIVE

## 2021-12-29 ENCOUNTER — Ambulatory Visit: Payer: Medicaid Other

## 2022-01-05 ENCOUNTER — Encounter: Payer: Self-pay | Admitting: Certified Nurse Midwife

## 2022-01-08 NOTE — L&D Delivery Note (Signed)
Delivery Note Amber Donovan is a 27 y.o. G4P3003 at [redacted]w[redacted]d admitted for SROM.   GBS Status: Positive/-- (07/06 0000) Maximum Maternal Temperature: 98.5  Labor course: Initial SVE: 4/50/-2. Augmentation with: Pitocin. She then progressed to complete.  ROM: 15h 51m with clear fluid  Birth: At 1402 a viable female was delivered in hands and knees position via spontaneous vaginal delivery (Presentation: cephalic;LOA). Nuchal and body cord present, baby somersaulted and delivered through. Shoulders and body delivered in usual fashion. Infant placed directly on mom's abdomen for bonding/skin-to-skin, baby dried and stimulated. Cord clamped x 2 after several minutes and cut by patient. Cord blood collected. The placenta separated spontaneously and delivered via gentle cord traction.  Pitocin infused rapidly IV per protocol. Fundus firm with massage.  Placenta inspected and appears to be intact with a 3 VC.  Placenta/Cord with the following complications: n/a. Cord pH: n/a Sponge and instrument count were correct x2.  Intrapartum complications:  None Anesthesia:  none Episiotomy: none Lacerations:  none Suture Repair:  n/a EBL (mL): 136   Infant: APGAR (1 MIN): 8   APGAR (5 MINS): 9   Infant weight: pending  Mom to postpartum.  Baby to Couplet care / Skin to Skin. Placenta to L&D   Plans to Bottlefeed Contraception: tubal ligation Circumcision: N/A  Note sent to [facility]: [place], CNM  01/29/2022 2:26 PM

## 2022-01-09 ENCOUNTER — Encounter: Payer: Self-pay | Admitting: Certified Nurse Midwife

## 2022-01-09 ENCOUNTER — Other Ambulatory Visit (HOSPITAL_COMMUNITY)
Admission: RE | Admit: 2022-01-09 | Discharge: 2022-01-09 | Disposition: A | Discharge status: Home / Self Care | Payer: Medicaid Other | Source: Ambulatory Visit | Attending: Certified Nurse Midwife | Admitting: Certified Nurse Midwife

## 2022-01-09 ENCOUNTER — Ambulatory Visit (INDEPENDENT_AMBULATORY_CARE_PROVIDER_SITE_OTHER): Payer: Medicaid Other | Admitting: Certified Nurse Midwife

## 2022-01-09 VITALS — BP 129/78 | HR 84 | Wt 182.0 lb

## 2022-01-09 DIAGNOSIS — G5600 Carpal tunnel syndrome, unspecified upper limb: Secondary | ICD-10-CM

## 2022-01-09 DIAGNOSIS — Z3483 Encounter for supervision of other normal pregnancy, third trimester: Secondary | ICD-10-CM

## 2022-01-09 DIAGNOSIS — Z3480 Encounter for supervision of other normal pregnancy, unspecified trimester: Secondary | ICD-10-CM

## 2022-01-09 DIAGNOSIS — Z3A36 36 weeks gestation of pregnancy: Secondary | ICD-10-CM

## 2022-01-09 DIAGNOSIS — O26893 Other specified pregnancy related conditions, third trimester: Secondary | ICD-10-CM

## 2022-01-09 NOTE — Progress Notes (Signed)
Pt c/o carpal tunnel

## 2022-01-09 NOTE — Progress Notes (Signed)
Subjective:  Amber Donovan is a 27 y.o. [phone] at [redacted]w[redacted]d being seen today for ongoing prenatal care.  She is currently monitored for the following issues for this low-risk pregnancy and has Gastroesophageal reflux disease; Supervision of other normal pregnancy, antepartum; Unwanted fertility; and GBS bacteriuria on their problem list.  Patient reports carpal tunnel symptoms, unable to sleep for more than 2 hrs at a time, worse at night. Has tried a cream that didn't help.  Contractions: Irritability. Vag. Bleeding: None.  Movement: Present. Denies leaking of fluid.   The following portions of the patient's history were reviewed and updated as appropriate: allergies, current medications, past family history, past medical history, past social history, past surgical history and problem list. Problem list updated.  Objective:   Vitals:   01/09/22 0808  BP: 129/78  Pulse: 84  Weight: 182 lb (82.6 kg)    Fetal Status: Fetal Heart Rate (bpm): 136 Fundal Height: 37 cm Movement: Present  Presentation: Vertex  General:  Alert, oriented and cooperative. Patient is in no acute distress.  Skin: Skin is warm and dry. No rash noted.   Cardiovascular: Normal heart rate noted  Respiratory: Normal respiratory effort, no problems with respiration noted  Abdomen: Soft, gravid, appropriate for gestational age. Pain/Pressure: Present     Pelvic: Vag. Bleeding: None     Cervical exam performed Dilation: 1 Effacement (%): 50 Station: -2  Extremities: Normal range of motion.  Edema: Mild pitting, slight indentation  Mental Status: Normal mood and affect. Normal behavior. Normal judgment and thought content.   Urinalysis:      Assessment and Plan:  Pregnancy: G4P3003 at [redacted]w[redacted]d  1. Supervision of other normal pregnancy, antepartum - Cervicovaginal ancillary only( [place])  2. [redacted] weeks gestation of pregnancy  3. Carpal tunnel syndrome during pregnancy  - wrist splint at hs and during day if can  tolerate - Unisom 1/2 tab for sleep  4. GBS bacteriuria - treat in labor  Preterm labor symptoms and general obstetric precautions including but not limited to vaginal bleeding, contractions, leaking of fluid and fetal movement were reviewed in detail with the patient. Please refer to After Visit Summary for other counseling recommendations.  Return in about 1 week (around 01/16/2022).   Julianne Handler, CNM

## 2022-01-10 LAB — CERVICOVAGINAL ANCILLARY ONLY
Chlamydia: NEGATIVE
Comment: NEGATIVE
Comment: NORMAL
Neisseria Gonorrhea: NEGATIVE

## 2022-01-17 ENCOUNTER — Encounter: Payer: Self-pay | Admitting: Obstetrics and Gynecology

## 2022-01-17 ENCOUNTER — Ambulatory Visit (INDEPENDENT_AMBULATORY_CARE_PROVIDER_SITE_OTHER): Payer: Medicaid Other | Admitting: Obstetrics and Gynecology

## 2022-01-17 VITALS — BP 116/71 | HR 88 | Wt 184.0 lb

## 2022-01-17 DIAGNOSIS — Z3009 Encounter for other general counseling and advice on contraception: Secondary | ICD-10-CM

## 2022-01-17 DIAGNOSIS — R8271 Bacteriuria: Secondary | ICD-10-CM

## 2022-01-17 DIAGNOSIS — Z3480 Encounter for supervision of other normal pregnancy, unspecified trimester: Secondary | ICD-10-CM

## 2022-01-17 DIAGNOSIS — Z3483 Encounter for supervision of other normal pregnancy, third trimester: Secondary | ICD-10-CM

## 2022-01-17 DIAGNOSIS — Z3A37 37 weeks gestation of pregnancy: Secondary | ICD-10-CM

## 2022-01-17 NOTE — Progress Notes (Signed)
   PRENATAL VISIT NOTE  Subjective:  Amber Donovan is a 27 y.o. [phone] at [redacted]w[redacted]d being seen today for ongoing prenatal care.  She is currently monitored for the following issues for this low-risk pregnancy and has Gastroesophageal reflux disease; Supervision of other normal pregnancy, antepartum; Unwanted fertility; and GBS bacteriuria on their problem list.  Patient reports no complaints.  Contractions: Irritability. Vag. Bleeding: None.  Movement: Present. Denies leaking of fluid.   The following portions of the patient's history were reviewed and updated as appropriate: allergies, current medications, past family history, past medical history, past social history, past surgical history and problem list.   Objective:   Vitals:   01/17/22 0958  BP: 116/71  Pulse: 88  Weight: 184 lb (83.5 kg)    Fetal Status: Fetal Heart Rate (bpm): 131 Fundal Height: 38 cm Movement: Present     General:  Alert, oriented and cooperative. Patient is in no acute distress.  Skin: Skin is warm and dry. No rash noted.   Cardiovascular: Normal heart rate noted  Respiratory: Normal respiratory effort, no problems with respiration noted  Abdomen: Soft, gravid, appropriate for gestational age.  Pain/Pressure: Present     Pelvic: Cervical exam deferred        Extremities: Normal range of motion.  Edema: Trace  Mental Status: Normal mood and affect. Normal behavior. Normal judgment and thought content.   Assessment and Plan:  Pregnancy: G4P3003 at [redacted]w[redacted]d 1. Supervision of other normal pregnancy, antepartum Patient is doing well without complaints  2. GBS bacteriuria Prophylaxis in labor and patient has been made aware of results  3. Unwanted fertility Desires BTL  4. [redacted] weeks gestation of pregnancy   Term labor symptoms and general obstetric precautions including but not limited to vaginal bleeding, contractions, leaking of fluid and fetal movement were reviewed in detail with the patient. Please  refer to After Visit Summary for other counseling recommendations.   Return in about 1 week (around 01/24/2022) for in person, ROB, Low risk.  Future Appointments  Date Time Provider [place]  01/24/2022  8:30 AM Inez Catalina, MD [facility]  01/26/2022  1:40 PM Berniece Salines, DO [facility] None  01/31/2022  8:50 AM Inez Catalina, MD [facility]    Mora Bellman, MD

## 2022-01-24 ENCOUNTER — Encounter: Payer: Medicaid Other | Admitting: Obstetrics and Gynecology

## 2022-01-26 ENCOUNTER — Ambulatory Visit: Payer: Medicaid Other | Admitting: Cardiology

## 2022-01-28 ENCOUNTER — Encounter (HOSPITAL_COMMUNITY): Payer: Self-pay | Admitting: Obstetrics and Gynecology

## 2022-01-28 ENCOUNTER — Inpatient Hospital Stay (HOSPITAL_COMMUNITY)
Admission: AD | Admit: 2022-01-28 | Discharge: 2022-01-31 | DRG: 798 | Disposition: A | Discharge status: Home / Self Care | Payer: Medicaid Other | Attending: Family Medicine | Admitting: Family Medicine

## 2022-01-28 DIAGNOSIS — Z3009 Encounter for other general counseling and advice on contraception: Secondary | ICD-10-CM | POA: Diagnosis present

## 2022-01-28 DIAGNOSIS — B338 Other specified viral diseases: Secondary | ICD-10-CM | POA: Insufficient documentation

## 2022-01-28 DIAGNOSIS — O9952 Diseases of the respiratory system complicating childbirth: Secondary | ICD-10-CM | POA: Diagnosis present

## 2022-01-28 DIAGNOSIS — Z3A39 39 weeks gestation of pregnancy: Secondary | ICD-10-CM

## 2022-01-28 DIAGNOSIS — Z3480 Encounter for supervision of other normal pregnancy, unspecified trimester: Principal | ICD-10-CM

## 2022-01-28 DIAGNOSIS — B974 Respiratory syncytial virus as the cause of diseases classified elsewhere: Secondary | ICD-10-CM | POA: Diagnosis present

## 2022-01-28 DIAGNOSIS — O99824 Streptococcus B carrier state complicating childbirth: Secondary | ICD-10-CM | POA: Diagnosis present

## 2022-01-28 DIAGNOSIS — R8271 Bacteriuria: Secondary | ICD-10-CM

## 2022-01-28 DIAGNOSIS — Z20822 Contact with and (suspected) exposure to covid-19: Secondary | ICD-10-CM | POA: Diagnosis present

## 2022-01-28 DIAGNOSIS — Z9851 Tubal ligation status: Secondary | ICD-10-CM

## 2022-01-28 DIAGNOSIS — Z302 Encounter for sterilization: Secondary | ICD-10-CM

## 2022-01-28 NOTE — MAU Note (Signed)
.  Amber Donovan is a 27 y.o. at [redacted]w[redacted]d here in MAU reporting: SROM at 2230 clear no odor. Pt reports +FM.  Pt also noticed a spot of blood on underwear at 5pm.  Pt is currently not feeling ctxs. Pt states she feels lower back pressure and rates 2/10   Vitals:   01/28/22 2340  BP: 120/83  Pulse: 94  Resp: 18  Temp: 98.3 F (36.8 C)  SpO2: 100%     Pain score: 2/10 lower back pressure.  BPZ:025 Lab orders placed from triage:   Labor eval, fern slide.

## 2022-01-29 ENCOUNTER — Inpatient Hospital Stay (HOSPITAL_COMMUNITY): Payer: Medicaid Other

## 2022-01-29 ENCOUNTER — Encounter (HOSPITAL_COMMUNITY)
Admission: AD | Disposition: A | Discharge status: Home / Self Care | Payer: Self-pay | Source: Home / Self Care | Attending: Family Medicine

## 2022-01-29 ENCOUNTER — Encounter (HOSPITAL_COMMUNITY): Payer: Self-pay | Admitting: Obstetrics and Gynecology

## 2022-01-29 ENCOUNTER — Other Ambulatory Visit: Payer: Self-pay

## 2022-01-29 DIAGNOSIS — Z3A39 39 weeks gestation of pregnancy: Secondary | ICD-10-CM | POA: Diagnosis not present

## 2022-01-29 DIAGNOSIS — Z302 Encounter for sterilization: Secondary | ICD-10-CM

## 2022-01-29 DIAGNOSIS — O99820 Streptococcus B carrier state complicating pregnancy: Secondary | ICD-10-CM | POA: Diagnosis not present

## 2022-01-29 DIAGNOSIS — B974 Respiratory syncytial virus as the cause of diseases classified elsewhere: Secondary | ICD-10-CM | POA: Diagnosis not present

## 2022-01-29 DIAGNOSIS — O4202 Full-term premature rupture of membranes, onset of labor within 24 hours of rupture: Secondary | ICD-10-CM | POA: Diagnosis not present

## 2022-01-29 DIAGNOSIS — Z20822 Contact with and (suspected) exposure to covid-19: Secondary | ICD-10-CM | POA: Diagnosis not present

## 2022-01-29 DIAGNOSIS — O26893 Other specified pregnancy related conditions, third trimester: Secondary | ICD-10-CM | POA: Diagnosis not present

## 2022-01-29 DIAGNOSIS — O99824 Streptococcus B carrier state complicating childbirth: Secondary | ICD-10-CM | POA: Diagnosis not present

## 2022-01-29 DIAGNOSIS — O9952 Diseases of the respiratory system complicating childbirth: Secondary | ICD-10-CM | POA: Diagnosis not present

## 2022-01-29 HISTORY — PX: TUBAL LIGATION: SHX77

## 2022-01-29 LAB — TYPE AND SCREEN
ABO/RH(D): O POS
Antibody Screen: NEGATIVE

## 2022-01-29 LAB — CBC
HCT: 35 % — ABNORMAL LOW (ref 36.0–46.0)
Hemoglobin: 11.8 g/dL — ABNORMAL LOW (ref 12.0–15.0)
MCH: 28.3 pg (ref 26.0–34.0)
MCHC: 33.7 g/dL (ref 30.0–36.0)
MCV: 83.9 fL (ref 80.0–100.0)
Platelets: 237 10*3/uL (ref 150–400)
RBC: 4.17 MIL/uL (ref 3.87–5.11)
RDW: 13.2 % (ref 11.5–15.5)
WBC: 10.7 10*3/uL — ABNORMAL HIGH (ref 4.0–10.5)
nRBC: 0 % (ref 0.0–0.2)

## 2022-01-29 LAB — RESP PANEL BY RT-PCR (RSV, FLU A&B, COVID)  RVPGX2
Influenza A by PCR: NEGATIVE
Influenza B by PCR: NEGATIVE
Resp Syncytial Virus by PCR: POSITIVE — AB
SARS Coronavirus 2 by RT PCR: NEGATIVE

## 2022-01-29 LAB — RPR: RPR Ser Ql: NONREACTIVE

## 2022-01-29 LAB — POCT FERN TEST: POCT Fern Test: POSITIVE

## 2022-01-29 SURGERY — LIGATION, FALLOPIAN TUBE, POSTPARTUM
Anesthesia: Spinal | Site: Abdomen

## 2022-01-29 MED ORDER — ONDANSETRON HCL 4 MG PO TABS: 4.0000 mg | ORAL_TABLET | ORAL | Status: DC | PRN

## 2022-01-29 MED ORDER — LACTATED RINGERS IV SOLN: INTRAVENOUS | Status: DC

## 2022-01-29 MED ORDER — SIMETHICONE 80 MG PO CHEW
80.0000 mg | CHEWABLE_TABLET | ORAL | Status: DC | PRN
  Administered 2022-01-30 (×2): 80 mg via ORAL
  Filled 2022-01-29 (×2): qty 1

## 2022-01-29 MED ORDER — FENTANYL INJECTION ORDERABLE
Status: DC | PRN
  Administered 2022-01-29: 25 ug via INTRATHECAL

## 2022-01-29 MED ORDER — ACETAMINOPHEN 10 MG/ML IV SOLN
INTRAVENOUS | Status: DC | PRN
  Administered 2022-01-29: 1000 mg via INTRAVENOUS

## 2022-01-29 MED ORDER — TERBUTALINE SULFATE 1 MG/ML IJ SOLN: 0.2500 mg | Freq: Once | INTRAMUSCULAR | Status: DC | PRN

## 2022-01-29 MED ORDER — PHENYLEPHRINE HCL-NACL 20-0.9 MG/250ML-% IV SOLN
INTRAVENOUS | Status: DC | PRN
  Administered 2022-01-29: 60 ug/min via INTRAVENOUS

## 2022-01-29 MED ORDER — MIDAZOLAM HCL 2 MG/2ML IJ SOLN
INTRAMUSCULAR | Status: AC
  Filled 2022-01-29: qty 2

## 2022-01-29 MED ORDER — SODIUM CHLORIDE 0.9 % IV SOLN
5.0000 10*6.[IU] | Freq: Once | INTRAVENOUS | Status: AC
  Administered 2022-01-29: 5 10*6.[IU] via INTRAVENOUS
  Filled 2022-01-29: qty 5

## 2022-01-29 MED ORDER — ACETAMINOPHEN 325 MG PO TABS
650.0000 mg | ORAL_TABLET | ORAL | Status: DC | PRN
  Administered 2022-01-29 – 2022-01-31 (×5): 650 mg via ORAL
  Filled 2022-01-29 (×5): qty 2

## 2022-01-29 MED ORDER — FENTANYL CITRATE (PF) 100 MCG/2ML IJ SOLN
50.0000 ug | INTRAMUSCULAR | Status: DC | PRN
  Administered 2022-01-29 (×3): 100 ug via INTRAVENOUS
  Filled 2022-01-29 (×3): qty 2

## 2022-01-29 MED ORDER — LACTATED RINGERS IV SOLN: INTRAVENOUS | Status: DC | PRN

## 2022-01-29 MED ORDER — OXYTOCIN-SODIUM CHLORIDE 30-0.9 UT/500ML-% IV SOLN
INTRAVENOUS | Status: AC
  Filled 2022-01-29: qty 500

## 2022-01-29 MED ORDER — ONDANSETRON HCL 4 MG/2ML IJ SOLN
INTRAMUSCULAR | Status: DC | PRN
  Administered 2022-01-29: 4 mg via INTRAVENOUS

## 2022-01-29 MED ORDER — IBUPROFEN 600 MG PO TABS
600.0000 mg | ORAL_TABLET | Freq: Four times a day (QID) | ORAL | Status: DC
  Administered 2022-01-29 – 2022-01-31 (×7): 600 mg via ORAL
  Filled 2022-01-29 (×7): qty 1

## 2022-01-29 MED ORDER — SODIUM CHLORIDE 0.9 % IR SOLN
Status: DC | PRN
  Administered 2022-01-29: 1000 mL

## 2022-01-29 MED ORDER — DIBUCAINE (PERIANAL) 1 % EX OINT: 1.0000 | TOPICAL_OINTMENT | CUTANEOUS | Status: DC | PRN

## 2022-01-29 MED ORDER — PHENYLEPHRINE 80 MCG/ML (10ML) SYRINGE FOR IV PUSH (FOR BLOOD PRESSURE SUPPORT)
PREFILLED_SYRINGE | INTRAVENOUS | Status: AC
  Filled 2022-01-29: qty 10

## 2022-01-29 MED ORDER — PSEUDOEPHEDRINE HCL 30 MG PO TABS
60.0000 mg | ORAL_TABLET | Freq: Two times a day (BID) | ORAL | Status: DC
  Administered 2022-01-29 – 2022-01-31 (×4): 60 mg via ORAL
  Filled 2022-01-29 (×5): qty 2

## 2022-01-29 MED ORDER — OXYTOCIN-SODIUM CHLORIDE 30-0.9 UT/500ML-% IV SOLN: 2.5000 [IU]/h | INTRAVENOUS | Status: DC

## 2022-01-29 MED ORDER — KETOROLAC TROMETHAMINE 30 MG/ML IJ SOLN
INTRAMUSCULAR | Status: AC
  Filled 2022-01-29: qty 1

## 2022-01-29 MED ORDER — SENNOSIDES-DOCUSATE SODIUM 8.6-50 MG PO TABS
2.0000 | ORAL_TABLET | Freq: Every day | ORAL | Status: DC
  Administered 2022-01-30 – 2022-01-31 (×2): 2 via ORAL
  Filled 2022-01-29 (×2): qty 2

## 2022-01-29 MED ORDER — LACTATED RINGERS IV SOLN: 500.0000 mL | INTRAVENOUS | Status: DC | PRN

## 2022-01-29 MED ORDER — GUAIFENESIN ER 600 MG PO TB12
600.0000 mg | ORAL_TABLET | Freq: Two times a day (BID) | ORAL | Status: DC
  Administered 2022-01-29: 600 mg via ORAL
  Filled 2022-01-29 (×3): qty 1

## 2022-01-29 MED ORDER — MIDAZOLAM HCL 2 MG/2ML IJ SOLN
INTRAMUSCULAR | Status: DC | PRN
  Administered 2022-01-29: 2 mg via INTRAVENOUS

## 2022-01-29 MED ORDER — MORPHINE SULFATE (PF) 0.5 MG/ML IJ SOLN
INTRAMUSCULAR | Status: AC
  Filled 2022-01-29: qty 10

## 2022-01-29 MED ORDER — OXYTOCIN-SODIUM CHLORIDE 30-0.9 UT/500ML-% IV SOLN
1.0000 m[IU]/min | INTRAVENOUS | Status: DC
  Administered 2022-01-29: 2 m[IU]/min via INTRAVENOUS
  Filled 2022-01-29: qty 1000

## 2022-01-29 MED ORDER — PENICILLIN G POT IN DEXTROSE 60000 UNIT/ML IV SOLN
3.0000 10*6.[IU] | INTRAVENOUS | Status: DC
  Administered 2022-01-29 (×2): 3 10*6.[IU] via INTRAVENOUS
  Filled 2022-01-29 (×2): qty 50

## 2022-01-29 MED ORDER — KETOROLAC TROMETHAMINE 30 MG/ML IJ SOLN
INTRAMUSCULAR | Status: DC | PRN
  Administered 2022-01-29: 30 mg via INTRAVENOUS

## 2022-01-29 MED ORDER — MEPERIDINE HCL 25 MG/ML IJ SOLN: 6.2500 mg | INTRAMUSCULAR | Status: DC | PRN

## 2022-01-29 MED ORDER — SOD CITRATE-CITRIC ACID 500-334 MG/5ML PO SOLN
30.0000 mL | ORAL | Status: DC | PRN
  Filled 2022-01-29: qty 30

## 2022-01-29 MED ORDER — FENTANYL CITRATE (PF) 100 MCG/2ML IJ SOLN
INTRAMUSCULAR | Status: AC
  Filled 2022-01-29: qty 2

## 2022-01-29 MED ORDER — DIPHENHYDRAMINE HCL 25 MG PO CAPS: 25.0000 mg | ORAL_CAPSULE | Freq: Four times a day (QID) | ORAL | Status: DC | PRN

## 2022-01-29 MED ORDER — LIDOCAINE HCL (PF) 1 % IJ SOLN: 30.0000 mL | INTRAMUSCULAR | Status: DC | PRN

## 2022-01-29 MED ORDER — OXYCODONE HCL 5 MG PO TABS: 5.0000 mg | ORAL_TABLET | Freq: Once | ORAL | Status: DC | PRN

## 2022-01-29 MED ORDER — DEXAMETHASONE SODIUM PHOSPHATE 4 MG/ML IJ SOLN
INTRAMUSCULAR | Status: DC | PRN
  Administered 2022-01-29: 4 mg via INTRAVENOUS

## 2022-01-29 MED ORDER — ACETAMINOPHEN 10 MG/ML IV SOLN
INTRAVENOUS | Status: AC
  Filled 2022-01-29: qty 100

## 2022-01-29 MED ORDER — COCONUT OIL OIL: 1.0000 | TOPICAL_OIL | Status: DC | PRN

## 2022-01-29 MED ORDER — WITCH HAZEL-GLYCERIN EX PADS: 1.0000 | MEDICATED_PAD | CUTANEOUS | Status: DC | PRN

## 2022-01-29 MED ORDER — ACETAMINOPHEN 325 MG PO TABS: 325.0000 mg | ORAL_TABLET | ORAL | Status: DC | PRN

## 2022-01-29 MED ORDER — ONDANSETRON HCL 4 MG/2ML IJ SOLN: 4.0000 mg | Freq: Once | INTRAMUSCULAR | Status: DC | PRN

## 2022-01-29 MED ORDER — OXYCODONE HCL 5 MG/5ML PO SOLN: 5.0000 mg | Freq: Once | ORAL | Status: DC | PRN

## 2022-01-29 MED ORDER — SOD CITRATE-CITRIC ACID 500-334 MG/5ML PO SOLN: 30.0000 mL | Freq: Once | ORAL | Status: DC

## 2022-01-29 MED ORDER — FENTANYL CITRATE (PF) 100 MCG/2ML IJ SOLN: 25.0000 ug | INTRAMUSCULAR | Status: DC | PRN

## 2022-01-29 MED ORDER — ZOLPIDEM TARTRATE 5 MG PO TABS: 5.0000 mg | ORAL_TABLET | Freq: Every evening | ORAL | Status: DC | PRN

## 2022-01-29 MED ORDER — ONDANSETRON HCL 4 MG/2ML IJ SOLN: 4.0000 mg | Freq: Four times a day (QID) | INTRAMUSCULAR | Status: DC | PRN

## 2022-01-29 MED ORDER — BUPIVACAINE IN DEXTROSE 0.75-8.25 % IT SOLN
INTRATHECAL | Status: DC | PRN
  Administered 2022-01-29: 1.5 mL via INTRATHECAL

## 2022-01-29 MED ORDER — PSEUDOEPHEDRINE HCL 30 MG PO TABS
60.0000 mg | ORAL_TABLET | Freq: Once | ORAL | Status: AC
  Administered 2022-01-29: 60 mg via ORAL
  Filled 2022-01-29: qty 2
  Filled 2022-01-29: qty 1

## 2022-01-29 MED ORDER — BENZOCAINE-MENTHOL 20-0.5 % EX AERO: 1.0000 | INHALATION_SPRAY | CUTANEOUS | Status: DC | PRN

## 2022-01-29 MED ORDER — FLUTICASONE PROPIONATE 50 MCG/ACT NA SUSP
2.0000 | Freq: Every day | NASAL | Status: DC
  Administered 2022-01-29: 2 via NASAL
  Filled 2022-01-29: qty 16

## 2022-01-29 MED ORDER — MISOPROSTOL 25 MCG QUARTER TABLET
25.0000 ug | ORAL_TABLET | Freq: Once | ORAL | Status: AC
  Administered 2022-01-29: 25 ug via VAGINAL
  Filled 2022-01-29: qty 1

## 2022-01-29 MED ORDER — STERILE WATER FOR IRRIGATION IR SOLN
Status: DC | PRN
  Administered 2022-01-29: 1000 mL

## 2022-01-29 MED ORDER — ONDANSETRON HCL 4 MG/2ML IJ SOLN
INTRAMUSCULAR | Status: AC
  Filled 2022-01-29: qty 2

## 2022-01-29 MED ORDER — EPHEDRINE SULFATE (PRESSORS) 50 MG/ML IJ SOLN
INTRAMUSCULAR | Status: DC | PRN
  Administered 2022-01-29: 5 mg via INTRAVENOUS

## 2022-01-29 MED ORDER — ONDANSETRON HCL 4 MG/2ML IJ SOLN: 4.0000 mg | INTRAMUSCULAR | Status: DC | PRN

## 2022-01-29 MED ORDER — PHENYLEPHRINE HCL-NACL 20-0.9 MG/250ML-% IV SOLN
INTRAVENOUS | Status: AC
  Filled 2022-01-29: qty 250

## 2022-01-29 MED ORDER — ACETAMINOPHEN 160 MG/5ML PO SOLN: 325.0000 mg | ORAL | Status: DC | PRN

## 2022-01-29 MED ORDER — TETANUS-DIPHTH-ACELL PERTUSSIS 5-2.5-18.5 LF-MCG/0.5 IM SUSY: 0.5000 mL | PREFILLED_SYRINGE | Freq: Once | INTRAMUSCULAR | Status: DC

## 2022-01-29 MED ORDER — OXYTOCIN BOLUS FROM INFUSION
333.0000 mL | Freq: Once | INTRAVENOUS | Status: AC
  Administered 2022-01-29: 333 mL via INTRAVENOUS

## 2022-01-29 MED ORDER — PRENATAL MULTIVITAMIN CH
1.0000 | ORAL_TABLET | Freq: Every day | ORAL | Status: DC
  Administered 2022-01-30 – 2022-01-31 (×2): 1 via ORAL
  Filled 2022-01-29 (×2): qty 1

## 2022-01-29 SURGICAL SUPPLY — 21 items
DRSG OPSITE POSTOP 3X4 (GAUZE/BANDAGES/DRESSINGS) ×1 IMPLANT
DURAPREP 26ML APPLICATOR (WOUND CARE) ×1 IMPLANT
GLOVE BIOGEL PI IND STRL 7.0 (GLOVE) ×2 IMPLANT
GLOVE SURG SS PI 7.0 STRL IVOR (GLOVE) ×1 IMPLANT
GOWN STRL REUS W/TWL LRG LVL3 (GOWN DISPOSABLE) ×1 IMPLANT
LIGASURE IMPACT 36 18CM CVD LR (INSTRUMENTS) IMPLANT
NEEDLE HYPO 22GX1.5 SAFETY (NEEDLE) ×1 IMPLANT
NS IRRIG 1000ML POUR BTL (IV SOLUTION) ×1 IMPLANT
PACK ABDOMINAL MINOR (CUSTOM PROCEDURE TRAY) ×1 IMPLANT
PROTECTOR NERVE ULNAR (MISCELLANEOUS) ×1 IMPLANT
SPONGE GAUZE 2X2 8PLY STRL LF (GAUZE/BANDAGES/DRESSINGS) ×1 IMPLANT
SPONGE LAP 4X18 RFD (DISPOSABLE) ×1 IMPLANT
SUT MON AB 2-0 CT1 36 (SUTURE) ×1 IMPLANT
SUT PLAIN 0 NONE (SUTURE) IMPLANT
SUT VIC AB 0 CT1 27 (SUTURE) ×1
SUT VIC AB 0 CT1 27XBRD ANBCTR (SUTURE) ×1 IMPLANT
SUT VIC AB 3-0 PS2 18 (SUTURE) ×1 IMPLANT
SUT VICRYL 0 UR6 27IN ABS (SUTURE) ×1 IMPLANT
SYR CONTROL 10ML LL (SYRINGE) ×1 IMPLANT
TOWEL OR 17X24 6PK STRL BLUE (TOWEL DISPOSABLE) ×1 IMPLANT
WATER STERILE IRR 1000ML POUR (IV SOLUTION) ×1 IMPLANT

## 2022-01-29 NOTE — H&P (Signed)
OBSTETRIC ADMISSION HISTORY AND PHYSICAL  Amber Donovan is a 27 y.o. female [phone] with IUP at [redacted]w[redacted]d by ultrasound presenting for spontaneous rupture of membranes. She reports +FMs, No LOF, no VB, no blurry vision, headaches or peripheral edema, and RUQ pain.  She plans on formula feeding. She request BTL for birth control. She received her prenatal care at  [place]: By ultrasound --->  Estimated Date of Delivery: 02/02/22  Sono:   @20w  0 d, CWD, normal anatomy, cephalic presentation, 147 g, 78% EFW   Prenatal History/Complications:  Patient Active Problem List   Diagnosis Date Noted   GBS bacteriuria 07/17/2021   Unwanted fertility 07/13/2021   Supervision of other normal pregnancy, antepartum 07/10/2021   Gastroesophageal reflux disease 10/02/2019    Nursing Staff Provider  Office Location  [place] Dating  Early Scan  [facility] Model [x ] Traditional [ ]  Centering [ ]  Mom-Baby Dyad    Language   Anatomy US  Nml with incomplete anatomy and has follow up> declines  Flu Vaccine  09/14/21 Genetic/Carrier Screen  NIPS:    AFP:    [place]:  TDaP Vaccine   11/02/21 Hgb A1C or  GTT Early  Third trimester declined - A1c 5.5  COVID Vaccine    LAB RESULTS   Rhogam  n/a Blood Type O/RH(D) POSITIVE/-- (07/06 0000)   Baby Feeding Plan Formula Antibody NO ANTIBODIES DETECTED (07/06 0000)  Contraception BTL Rubella 10.20 (07/06 0000)  Circumcision N/a RPR NON-REACTIVE (07/06 0000)   Pediatrician   HBsAg NON-REACTIVE (07/06 0000)   Support Person Amber Donovan HCVAb Negative  Prenatal Classes  HIV NON-REACTIVE (07/06 0000)     BTL Consent  GBS  Pos in urine--Resist to clinda  VBAC Consent  Pap 7/21       DME Rx [x ] BP cuff [ ]  Weight Scale Waterbirth  [ ]  Class [ ]  Consent [ ]  CNM visit  PHQ9 & GAD7 [ x ] new OB [ x ] 28 weeks  [x  ] 36 weeks Induction  [ ]  Orders Entered [ ] Foley Y/N    Past Medical History: Past Medical History:  Diagnosis Date   Asthma    GERD (gastroesophageal  reflux disease)     Past Surgical History: Past Surgical History:  Procedure Laterality Date   CHOLECYSTECTOMY     WISDOM TOOTH EXTRACTION      Obstetrical History: OB History     Gravida  4   Para  3   Term  3   Preterm      AB      Living  3      SAB      IAB      Ectopic      Multiple  0   Live Births  3           Social History Social History   Socioeconomic History   Marital status: Married    Spouse name: Not on file   Number of children: Not on file   Years of education: Not on file   Highest education level: Not on file  Occupational History   Occupation: truck driver  Tobacco Use   Smoking status: Never   Smokeless tobacco: Never  Vaping Use   Vaping Use: Never used  Substance and Sexual Activity   Alcohol use: Never   Drug use: Never   Sexual activity: Yes    Partners: Male    Birth control/protection: None  Other Topics  Concern   Not on file  Social History Narrative   Not on file   Social Determinants of Health   Financial Resource Strain: Not on file  Food Insecurity: Not on file  Transportation Needs: Not on file  Physical Activity: Not on file  Stress: Not on file  Social Connections: Not on file    Family History: Family History  Problem Relation Age of Onset   Diabetes Mother    Diabetes Father    Prostate cancer Maternal Grandfather    Diabetes Paternal Grandmother    Hodgkin's lymphoma Paternal Grandfather     Allergies: Allergies  Allergen Reactions   Hydrocodone Nausea Only    Headache   Hydrocodone-Acetaminophen Nausea And Vomiting and Other (See Comments)    Had this reaction at age 79 with wisdom teeth extraction   Sunflower Oil Anaphylaxis    Seeds also    Medications Prior to Admission  Medication Sig Dispense Refill Last Dose   cholecalciferol (VITAMIN D3) 25 MCG (1000 UNIT) tablet Take 1,000 Units by mouth daily.   01/28/2022   Prenatal Vit-Fe Fumarate-FA (PRENATAL MULTIVITAMIN) TABS  tablet Take 1 tablet by mouth daily at 12 noon.   01/27/2022   thiamine (VITAMIN B-1) 50 MG tablet Take 50 mg by mouth daily.   01/28/2022     Review of Systems   All systems reviewed and negative except as stated in HPI  Blood pressure 120/83, pulse 94, temperature 98.3 F (36.8 C), temperature source Oral, resp. rate 18, height 5\' 4"  (1.626 m), weight 84.4 kg, last menstrual period 04/28/2021, SpO2 100 %, unknown if currently breastfeeding. General appearance: alert, cooperative, appears stated age, and mild distress Lungs: clear to auscultation bilaterally Heart: regular rate and rhythm Abdomen: soft, non-tender; bowel sounds normal Pelvic: No lesions Extremities: Homans sign is negative, no sign of DVT Presentation: cephalic Fetal monitoringBaseline: 130 bpm, Variability: Good {> 6 bpm), Accelerations: Reactive, and Decelerations: Absent Uterine activityFrequency: Every 5-6 minutes     Prenatal labs: ABO, Rh: O/RH(D) POSITIVE/-- (07/06 0000) Antibody: NO ANTIBODIES DETECTED (07/06 0000) Rubella: 10.20 (07/06 0000) RPR: Non Reactive (12/18 1030)  HBsAg: NON-REACTIVE (07/06 0000)  HIV: Non Reactive (12/18 1030)  GBS:    1 hr Glucola normal Genetic screening not done Anatomy US normal, incomplete  Prenatal Transfer Tool  Maternal Diabetes: No Genetic Screening: Declined Maternal Ultrasounds/Referrals: Normal incomplete Fetal Ultrasounds or other Referrals:  None Maternal Substance Abuse:  No Significant Maternal Medications:  None Significant Maternal Lab Results:  Group B Strep positive Number of Prenatal Visits:greater than 3 verified prenatal visits Other Comments:  None  No results found for this or any previous visit (from the past 24 hour(s)).  Patient Active Problem List   Diagnosis Date Noted   GBS bacteriuria 07/17/2021   Unwanted fertility 07/13/2021   Supervision of other normal pregnancy, antepartum 07/10/2021   Gastroesophageal reflux disease  10/02/2019    Assessment/Plan:  Amber Donovan is a 27 y.o. G4P3003 at [redacted]w[redacted]d here for SROM at 50  #Labor:Expectant management. Cytotec 91mcg Vaginal #Pain: IV pain meds, epidural upon request #FWB: Cat 1  #ID:  GBS pos-PCN #MOF: Formula #MOC: BTL, paperwork signed 9/7   Shelda Pal, DO  01/29/2022, 12:15 AM

## 2022-01-29 NOTE — Anesthesia Procedure Notes (Signed)
Spinal  Patient location during procedure: OR Start time: 01/29/2022 3:30 PM End time: 01/29/2022 3:35 PM Reason for block: surgical anesthesia Staffing Anesthesiologist: Janeece Riggers, MD Performed by: Janeece Riggers, MD Authorized by: Janeece Riggers, MD   Preanesthetic Checklist Completed: patient identified, IV checked, site marked, risks and benefits discussed, surgical consent, monitors and equipment checked, pre-op evaluation and timeout performed Spinal Block Patient position: sitting Prep: DuraPrep Patient monitoring: heart rate, cardiac monitor, continuous pulse ox and blood pressure Approach: midline Location: L3-4 Injection technique: single-shot Needle Needle type: Sprotte  Needle gauge: 24 G Needle length: 9 cm Assessment Sensory level: T4 Events: CSF return

## 2022-01-29 NOTE — Anesthesia Postprocedure Evaluation (Signed)
Anesthesia Post Note  Patient: Jamaira Sherk  Procedure(s) Performed: POST PARTUM TUBAL LIGATION (Abdomen)     Patient location during evaluation: PACU Anesthesia Type: Spinal Level of consciousness: oriented and awake and alert Pain management: pain level controlled Vital Signs Assessment: post-procedure vital signs reviewed and stable Respiratory status: spontaneous breathing, respiratory function stable and patient connected to nasal cannula oxygen Cardiovascular status: blood pressure returned to baseline and stable Postop Assessment: no headache, no backache and no apparent nausea or vomiting Anesthetic complications: no  No notable events documented.  Last Vitals:  Vitals:   01/29/22 1800 01/29/22 1819  BP:  116/78  Pulse:  65  Resp:  20  Temp: 36.5 C 36.5 C  SpO2:  100%    Last Pain:  Vitals:   01/29/22 1819  TempSrc: Oral  PainSc: 0-No pain   Pain Goal:                   Jestina Stephani

## 2022-01-29 NOTE — Progress Notes (Signed)
Amber Donovan is a 27 y.o. [phone] at [redacted]w[redacted]d admitted for SROM  Subjective: Amber Donovan is doing well. Reports irregular, mild contractions. She is requesting Sudafed to help with congestion.   Objective: BP (!) 100/59   Pulse 81   Temp 98.1 F (36.7 C) (Oral)   Resp 16   Ht 5\' 4"  (1.626 m)   Wt 84.1 kg   LMP 04/28/2021   SpO2 100%   BMI 31.82 kg/m  No intake/output data recorded. No intake/output data recorded.  FHT: 120 bpm, moderate variability, +15x15 accels, no decels UC: irregular SVE:   Dilation: 5 Effacement (%): 50 Station: -3 Exam by:: [employer] RN  Labs: Lab Results  Component Value Date   WBC 10.7 (H) 01/29/2022   HGB 11.8 (L) 01/29/2022   HCT 35.0 (L) 01/29/2022   MCV 83.9 01/29/2022   PLT 237 01/29/2022    Assessment / Plan: Amber Donovan is a 27 y.o. G4P3003 at [redacted]w[redacted]d admitted for SROM  Labor: Will start low dose Pit and titrate per unit policy RSV positive: Patient requesting Sudafed Fetal Wellbeing:  Category I Pain Control:  PRN per patient request I/D:  GBS pos > PCN Anticipated MOD:  Stella, CNM 01/29/2022, 9:16 AM

## 2022-01-29 NOTE — Progress Notes (Signed)
Amber Donovan is a 27 y.o. [phone] at [redacted]w[redacted]d admitted for SROM  Subjective: Amber Donovan is reporting frequent contractions. She is breathing well through them, currently sitting on birthing ball  Objective: BP 126/83   Pulse 87   Temp 97.8 F (36.6 C) (Oral)   Resp 16   Ht 5\' 4"  (1.626 m)   Wt 84.1 kg   LMP 04/28/2021   SpO2 100%   BMI 31.82 kg/m  No intake/output data recorded. No intake/output data recorded.  FHT: 115 bpm, moderate variability, +15x15 accels, +early decels UC: Q 2-55mins SVE:   Dilation: 7 Effacement (%): 70 Station: -2 Exam by:: [employer] RN  Labs: Lab Results  Component Value Date   WBC 10.7 (H) 01/29/2022   HGB 11.8 (L) 01/29/2022   HCT 35.0 (L) 01/29/2022   MCV 83.9 01/29/2022   PLT 237 01/29/2022    Assessment / Plan: Amber Donovan is a 27 y.o. G4P3003 at 101w3d admitted for SROM  Labor: Progressing Fetal Wellbeing:  Category I Pain Control:  PRN per patient request I/D:  GBS pos > PCN Anticipated MOD:  McGrath, CNM 01/29/2022, 1:05 PM

## 2022-01-29 NOTE — Op Note (Signed)
Postpartum Tubal Ligation Operative Note   Patient: Amber Donovan  Date of Procedure: 01/29/2022  Procedure: Postpartum bilateral Tubal Ligation via Bilateral salpingectomy   Indications: undesired fertility  Pre-operative Diagnosis: Patient desires sterilization.   Post-operative Diagnosis: Same  Surgeon: Surgeon(s) and Role:    * Dione Plover, Annice Needy, MD - Primary    * Radene Gunning, MD - Assisting  An experienced assistant was required given the standard of surgical care given the complexity of the case.  This assistant was needed for exposure, dissection, suctioning, retraction, instrument exchange, assisting with delivery with administration of fundal pressure, and for overall help during the procedure.   Anesthesia: spinal  Anesthesiologist: Janeece Riggers, MD   Antibiotics: None   Estimated Blood Loss: minimal ml   Total IV Fluids: 100 ml  Urine Output:  foley to be placed after procedure  Specimens: bilateral fallopian tube to pathology   Complications:  minor burn to periumbilical skin    Indications: [name] is a 27 y.o. [id number] with undesired fertility, status post vaginal delivery, desires permanent sterilization.  Other reversible forms of contraception were discussed with patient; she declines all other modalities. Risks of procedure discussed with patient including but not limited to: risk of regret, permanence of method, bleeding, infection, injury to surrounding organs and need for additional procedures.  Failure risk of 1-2 % with increased risk of ectopic gestation if pregnancy occurs and possibility of post-tubal pain syndrome also discussed with patient.  Findings: Normal uterus, Normal bilateral fallopian tubes, Normal bilateral ovaries.  Procedure Details: The patient was taken to the operating room where spinal anesthesia was dosed and found to be adequate.  She was then placed in the dorsal supine position and prepped and draped in sterile  fashion.  After an adequate timeout was performed, attention was turned to the patient's abdomen where a small transverse skin incision was made under the umbilical fold. The incision was taken down to the layer of fascia using the scalpel, and fascia was incised, and the incision was extended bilaterally. The peritoneum was entered in a sharp fashion.   Attention was then turned to the fallopian tubes. The left Fallopian tube was identified and then traced to it's fimbriae. Using the Ligasure device and taking care to avoid large vascular structures, the left fallopian tube was removed sequentially from the fimbriae to the cornua, with excellent hemostasis noted. Attention was then turned to the right fallopian tube, and after confirmation of identification by tracing the tube out to the fimbriae, the same procedure was then performed on the right side with excellent hemostasis noted.  Good hemostasis was noted overall. All laps and instruments were then removed from the patient's abdomen and the fascial incision was repaired with 0 Vicryl. The subcutaneous tissue was reapproximated with the remainder of the 0 Vicryl.   Upon examination of the skin surrounding the umbilicus inferiorly two areas of superficial burn were noted. The skin was closed with a 2-0 Monocryl subcuticular stitch. The patient tolerated the procedure well.  Instrument, sponge, and needle counts were correct times three.  The patient was then taken to the recovery room awake and in stable condition.  Disposition: PACU - hemodynamically stable.    Signed: Clarnce Flock, MD/MPH Attending Family Medicine Physician, [facility] for [facility], [place]

## 2022-01-29 NOTE — Discharge Summary (Signed)
Postpartum Discharge Summary  Date of Service updated***     Patient Name: Amber Donovan DOB: 16-Jan-1995 MRN: [id number]  Date of admission: 01/28/2022 Delivery date:01/29/2022  Delivering provider: Renee Harder  Date of discharge: 01/29/2022  Admitting diagnosis: Normal labor [O80, Z37.9] Intrauterine pregnancy: [redacted]w[redacted]d     Secondary diagnosis:  Principal Problem:   Normal labor Active Problems:   Supervision of other normal pregnancy, antepartum   Unwanted fertility   GBS bacteriuria  Additional problems: ***    Discharge diagnosis: Term Pregnancy Delivered                                              Post partum procedures:{Postpartum procedures:23558} Augmentation: Pitocin Complications: None  Hospital course: Onset of Labor With Vaginal Delivery      27 y.o. yo [id number] at [redacted]w[redacted]d was admitted in Latent Labor on 01/28/2022. Labor course was complicated by none.  Membrane Rupture Time/Date: 10:30 PM ,01/28/2022   Delivery Method:Vaginal, Spontaneous  Episiotomy: None  Lacerations:  None  Patient had a postpartum course complicated by ***.  She is ambulating, tolerating a regular diet, passing flatus, and urinating well. Patient is discharged home in stable condition on 01/29/22.  Newborn Data: Birth date:01/29/2022  Birth time:2:02 PM  Gender:Female  Living status:Living  Apgars:8 ,9  Weight:   Magnesium Sulfate received: {Mag received:30440022} BMZ received: No Rhophylac:N/A MMR:N/A T-DaP:Given prenatally Flu: N/A Transfusion:{Transfusion received:30440034}  Physical exam  Vitals:   01/29/22 1035 01/29/22 1131 01/29/22 1237 01/29/22 1418  BP: (!) 106/59 125/73 126/83 124/75  Pulse: 85 84 87 81  Resp:  16    Temp: (!) 97.5 F (36.4 C)  97.8 F (36.6 C)   TempSrc: Oral  Oral   SpO2:      Weight:      Height:       General: {Exam; general:21111117} Lochia: {Desc; appropriate/inappropriate:30686::"appropriate"} Uterine Fundus: {Desc;  firm/soft:30687} Incision: {Exam; incision:21111123} DVT Evaluation: {Exam; dvt:2111122} Labs: Lab Results  Component Value Date   WBC 10.7 (H) 01/29/2022   HGB 11.8 (L) 01/29/2022   HCT 35.0 (L) 01/29/2022   MCV 83.9 01/29/2022   PLT 237 01/29/2022      Latest Ref Rng & Units 12/25/2021   10:30 AM  CMP  Glucose 70 - 99 mg/dL 65   BUN 6 - 20 mg/dL 5   Creatinine 0.57 - 1.00 mg/dL 0.61   Sodium 134 - 144 mmol/L 140   Potassium 3.5 - 5.2 mmol/L 3.9   Chloride 96 - 106 mmol/L 106   CO2 20 - 29 mmol/L 20   Calcium 8.7 - 10.2 mg/dL 8.6   Total Protein 6.0 - 8.5 g/dL 6.1   Total Bilirubin 0.0 - 1.2 mg/dL <0.2   Alkaline Phos 44 - 121 IU/L 143   AST 0 - 40 IU/L 17   ALT 0 - 32 IU/L 10    Edinburgh Score:    01/04/2020    9:16 AM  Edinburgh Postnatal Depression Scale Screening Tool  I have been able to laugh and see the funny side of things. 0  I have looked forward with enjoyment to things. 0  I have blamed myself unnecessarily when things went wrong. 0  I have been anxious or worried for no good reason. 0  I have felt scared or panicky for no good reason. 0  Things have  been getting on top of me. 0  I have been so unhappy that I have had difficulty sleeping. 0  I have felt sad or miserable. 0  I have been so unhappy that I have been crying. 0  The thought of harming myself has occurred to me. 0  Edinburgh Postnatal Depression Scale Total 0     After visit meds:  Allergies as of 01/29/2022       Reactions   Hydrocodone Nausea Only   Headache   Hydrocodone-acetaminophen Nausea And Vomiting, Other (See Comments)   Had this reaction at age 51 with wisdom teeth extraction   Sunflower Oil Anaphylaxis   Seeds also     Med Rec must be completed prior to using this [facility]***        Discharge home in stable condition Infant Feeding: {Baby feeding:23562} Infant Disposition:{CHL IP OB HOME WITH VZDGLO:75643} Discharge instruction: per After Visit Summary and  Postpartum booklet. Activity: Advance as tolerated. Pelvic rest for 6 weeks.  Diet: {OB PIRJ:18841660} Future Appointments: Future Appointments  Date Time Provider [place]  01/31/2022  8:50 AM Inez Catalina, MD [facility]   Follow up Visit:  Message sent to [place] on 1/22 by D. Moshe Cipro, CNM  Please schedule this patient for a In person postpartum visit in 6 weeks with the following provider: Any provider. Additional Postpartum F/U:{PP Procedure:23957}  Low risk pregnancy complicated by: RSV Delivery mode:  Vaginal, Spontaneous  Anticipated Birth Control:  {Birth Control:23956}   01/29/2022 Renee Harder, CNM

## 2022-01-29 NOTE — Transfer of Care (Signed)
Immediate Anesthesia Transfer of Care Note  Patient: Amber Donovan  Procedure(s) Performed: POST PARTUM TUBAL LIGATION (Abdomen)  Patient Location: PACU  Anesthesia Type:Spinal  Level of Consciousness: awake, alert , and oriented  Airway & Oxygen Therapy: Patient Spontanous Breathing  Post-op Assessment: Report given to RN and Post -op Vital signs reviewed and stable  Post vital signs: Reviewed and stable  Last Vitals:  Vitals Value Taken Time  BP 104/65 01/29/22 1628  Temp    Pulse 65 01/29/22 1629  Resp 15 01/29/22 1629  SpO2 100 % 01/29/22 1629  Vitals shown include unvalidated device data.  Last Pain:  Vitals:   01/29/22 1300  TempSrc:   PainSc: 8          Complications: No notable events documented.

## 2022-01-29 NOTE — Anesthesia Preprocedure Evaluation (Signed)
Anesthesia Evaluation  Patient identified by MRN, date of birth, ID band Patient awake    Reviewed: Allergy & Precautions, H&P , NPO status , Patient's Chart, lab work & pertinent test results, reviewed documented beta blocker date and time   Airway Mallampati: I  TM Distance: >3 FB Neck ROM: full    Dental no notable dental hx. (+) Teeth Intact   Pulmonary neg pulmonary ROS RSV   Pulmonary exam normal breath sounds clear to auscultation       Cardiovascular negative cardio ROS Normal cardiovascular exam Rhythm:regular Rate:Normal     Neuro/Psych negative neurological ROS  negative psych ROS   GI/Hepatic negative GI ROS, Neg liver ROS,,,  Endo/Other  negative endocrine ROS    Renal/GU negative Renal ROS  negative genitourinary   Musculoskeletal   Abdominal   Peds  Hematology negative hematology ROS (+)   Anesthesia Other Findings   Reproductive/Obstetrics (+) Pregnancy                             Anesthesia Physical Anesthesia Plan  ASA: 2  Anesthesia Plan: Spinal   Post-op Pain Management: Minimal or no pain anticipated   Induction: Intravenous  PONV Risk Score and Plan: 2 and Treatment may vary due to age or medical condition  Airway Management Planned: Natural Airway and Simple Face Mask  Additional Equipment: None  Intra-op Plan:   Post-operative Plan:   Informed Consent: I have reviewed the patients History and Physical, chart, labs and discussed the procedure including the risks, benefits and alternatives for the proposed anesthesia with the patient or authorized representative who has indicated his/her understanding and acceptance.       Plan Discussed with: Anesthesiologist  Anesthesia Plan Comments:        Anesthesia Quick Evaluation

## 2022-01-29 NOTE — Progress Notes (Signed)
Pt informed that the ultrasound is considered a limited OB ultrasound and is not intended to be a complete ultrasound exam.  Patient also informed that the ultrasound is not being completed with the intent of assessing for fetal or placental anomalies or any pelvic abnormalities.  Explained that the purpose of today's ultrasound is to assess for  presentation.  Patient acknowledges the purpose of the exam and the limitations of the study.    Cephalic  I supervised Youlanda Roys RN in performance of bedside ultrasound  Jorje Guild, NP

## 2022-01-29 NOTE — MAU Note (Signed)
Vertex confirmed with Bedside US supervised by Jorje Guild, NP and performed by Youlanda Roys, RN.

## 2022-01-30 ENCOUNTER — Encounter (HOSPITAL_COMMUNITY): Payer: Self-pay | Admitting: Family Medicine

## 2022-01-30 MED ORDER — SALINE SPRAY 0.65 % NA SOLN
1.0000 | NASAL | Status: DC | PRN
  Filled 2022-01-30: qty 44

## 2022-01-30 MED ORDER — FLUTICASONE PROPIONATE 50 MCG/ACT NA SUSP
2.0000 | Freq: Every day | NASAL | Status: DC
  Administered 2022-01-30: 2 via NASAL
  Filled 2022-01-30: qty 16

## 2022-01-30 MED ORDER — OXYCODONE HCL 5 MG PO TABS
5.0000 mg | ORAL_TABLET | ORAL | Status: DC | PRN
  Administered 2022-01-30 – 2022-01-31 (×4): 5 mg via ORAL
  Filled 2022-01-30 (×5): qty 1

## 2022-01-30 MED FILL — Fentanyl Citrate Preservative Free (PF) Inj 100 MCG/2ML: INTRAMUSCULAR | Qty: 25 | Status: AC

## 2022-01-30 NOTE — Progress Notes (Addendum)
POSTPARTUM PROGRESS NOTE  Post Partum Day 1/POD#1  Subjective:  Amber Donovan is a 27 y.o. [id number] s/p VD/BTL at [redacted]w[redacted]d.  She reports she is doing well. No acute events overnight. She denies any problems with ambulating, voiding or po intake. Denies nausea or vomiting.  Pain is well controlled.  Lochia is appropriate.  Objective: Blood pressure 126/84, pulse 75, temperature 97.8 F (36.6 C), temperature source Axillary, resp. rate 18, height 5\' 4"  (1.626 m), weight 84.1 kg, last menstrual period 04/28/2021, SpO2 100 %, unknown if currently breastfeeding.  Physical Exam:  General: alert, cooperative and no distress Chest: no respiratory distress Heart:regular rate, distal pulses intact Abdomen: soft, nontender,  Uterine Fundus: firm, appropriately tender DVT Evaluation: No calf swelling or tenderness Extremities: Mild non-pitting LE edema Skin: warm, dry  Recent Labs    01/29/22 0036  HGB 11.8*  HCT 35.0*    Assessment/Plan: Amber Donovan is a 27 y.o. [id number] s/p VD/BTL at [redacted]w[redacted]d   PPD#1/POD#1 - Doing well  Routine postpartum care  RSV+ Incidental, asymptomatic -Continue precautions  Contraception: BTL done Feeding: Both Dispo: Plan for discharge 1/23-24; having transportation issue and pending SW consult for assistance with car seat.   LOS: 1 day   Gerlene Fee, DO OB Fellow, [place] for [place] 01/30/2022, 7:04 AM

## 2022-01-31 ENCOUNTER — Encounter: Payer: Medicaid Other | Admitting: Obstetrics and Gynecology

## 2022-01-31 ENCOUNTER — Other Ambulatory Visit (HOSPITAL_COMMUNITY): Payer: Self-pay

## 2022-01-31 DIAGNOSIS — Z9851 Tubal ligation status: Secondary | ICD-10-CM

## 2022-01-31 DIAGNOSIS — B338 Other specified viral diseases: Secondary | ICD-10-CM | POA: Insufficient documentation

## 2022-01-31 MED ORDER — OXYCODONE HCL 5 MG PO TABS
5.0000 mg | ORAL_TABLET | ORAL | 0 refills | Status: AC | PRN
  Filled 2022-01-31: qty 18, 3d supply, fill #0

## 2022-01-31 MED ORDER — IBUPROFEN 600 MG PO TABS
600.0000 mg | ORAL_TABLET | Freq: Four times a day (QID) | ORAL | 0 refills | Status: DC
  Filled 2022-01-31: qty 30, 8d supply, fill #0

## 2022-01-31 MED ORDER — ACETAMINOPHEN 325 MG PO TABS
650.0000 mg | ORAL_TABLET | ORAL | 0 refills | Status: DC | PRN
  Filled 2022-01-31: qty 30, 3d supply, fill #0

## 2022-01-31 NOTE — Lactation Note (Signed)
This note was copied from a baby's chart. Lactation Consultation Note  Patient Name: Girl Hilja Kintzel [id number] Date: 01/31/2022 Reason for consult: Initial assessment Age:29 hours   LC Note:  Spoke with RN; mother requesting a manual pump, however, does not wish for a lactation consult.  RN completing discharge and will bring a manual pump to mother.     Maternal Data    Feeding Mother's Current Feeding Choice: Breast Milk and Formula Nipple Type: Slow - flow  LATCH Score                    Lactation Tools Discussed/Used    Interventions    Discharge Pump: Manual  Consult Status Consult Status: Complete    Onelia Cadmus R Maysun Meditz 01/31/2022, 1:29 PM

## 2022-01-31 NOTE — Social Work (Signed)
CSW received consult for MOB needing a car seat and clothing for the infant. CSW spoke with MOB via telephone due to RSV positive.  CSW introduced self, CSW role and reason for call MOB agreed it was a good time to talk. CSW inquired about how MOB was feeling, MOB reported good. CSW inquired about MOB 's needs for the infant. MOB reported she needed a car seat, baby blankets, and bottles. CSW notified MOB a car seat and baby blanket could be provided. MOB reported she would be able to get the other essential items for the infant. CSW inquired if MOB received WIC or food stamps. MOB reported yes she receives WIC and has called to schedule an appointment. CSW delivered the car seat and baby blanket to MOB's nurse to provide to MOB.   CSW provided review of Sudden Infant Death Syndrome (SIDS) precautions.  MOB reported she has a bassinet for the infant to sleep. CSW identifies no further need for intervention and no barriers to discharge at this time.  Amber Donovan, LCSWA Clinical Social Worker [phone] 

## 2022-02-02 ENCOUNTER — Inpatient Hospital Stay (HOSPITAL_COMMUNITY): Admit: 2022-02-02 | Payer: Self-pay

## 2022-02-02 LAB — SURGICAL PATHOLOGY

## 2022-02-06 ENCOUNTER — Telehealth (HOSPITAL_COMMUNITY): Payer: Self-pay

## 2022-02-06 ENCOUNTER — Ambulatory Visit: Payer: Medicaid Other

## 2022-02-06 NOTE — Telephone Encounter (Signed)
Patient did not answer phone call. Voicemail left for patient.   Amber Donovan [facility] 02/06/22,1549

## 2022-03-07 NOTE — Progress Notes (Signed)
Amber Donovan  Amber Donovan is a 27 y.o. [phone] female who presents for a postpartum visit. She is 5 weeks postpartum following a normal spontaneous vaginal delivery.  I have fully reviewed the prenatal and intrapartum course. The delivery was at 39.3 gestational weeks.  Anesthesia: none. Postpartum course has been unremarkable. Baby is doing well. Baby is feeding by bottle - Similac Sensitive RS. Bleeding no bleeding. Bowel function is normal. Bladder function is normal. Patient is not sexually active. Contraception method is tubal ligation. Postpartum depression screening: negative.  Requests PCP referral in [place]. Has longstanding issues with her ears & eyes that she would like evaluated.   Has questions about maintaining medicaid.  Otherwise feeling well.   Edinburgh Postnatal Depression Scale - 03/07/22 1458       Edinburgh Postnatal Depression Scale:  In the Past 7 Days   I have been able to laugh and see the funny side of things. 0    I have looked forward with enjoyment to things. 0    I have blamed myself unnecessarily when things went wrong. 2    I have been anxious or worried for no good reason. 2    I have felt scared or panicky for no good reason. 1    Things have been getting on top of me. 1    I have been so unhappy that I have had difficulty sleeping. 0    I have felt sad or miserable. 0    I have been so unhappy that I have been crying. 1    The thought of harming myself has occurred to me. 0    Edinburgh Postnatal Depression Scale Total 7            Health Maintenance Due  Topic Date Due   COVID-19 Vaccine (1) Never done   The following portions of the patient's history were reviewed and updated as appropriate: allergies, current medications, past family history, past medical history, past social history, past surgical history, and problem list.  Review of Systems Pertinent items are noted in HPI.  Objective:  BP 96/61   Pulse 74   Ht  '5\' 4"'$  (1.626 m)   Wt 164 lb (74.4 kg)   LMP 04/28/2021   Breastfeeding No   BMI 28.15 kg/m    General:  alert and no distress   Breasts:  not indicated  Lungs: Normal respiratory effort  Heart:  Normal peripheral perfusion  Abdomen: Soft, nontender, nondistended  Wound Incision well healed. Small burn noted below umbilicus (reviewed - occurred during tubal) that is healing well.       Assessment:   Postpartum exam  Does not have primary care provider -     Ambulatory referral to Family Practice  Urinary incontinence, unspecified type -     Ambulatory referral to Physical Therapy  Plan:   Essential components of care per ACOG recommendations:  1.  Mood and well being: Patient with negative depression screening today. Reviewed local resources for support.  - Patient tobacco use? No.   - hx of drug use? No.    2. Infant care and feeding:  -Patient currently breastmilk feeding? No.  -Social determinants of health (SDOH) reviewed in EPIC. The following needs were identified - health insurance concerns. Message routed to LCSW for assistance.  3. Sexuality, contraception and birth spacing - s/p postpartum tubal ligation, pt happy w/ her decision  4. Sleep and fatigue -Encouraged family/partner/community support of 4 hrs of  uninterrupted sleep to help with mood and fatigue  5. Physical Recovery  - Discussed patients delivery and complications. She describes her labor as good. - Patient had a Vaginal, no problems at delivery. No lacs - Patient has urinary incontinence? Yes. Offered PT and patient accepted. Patient was referred to pelvic floor PT.  - Patient is safe to resume physical and sexual activity  6.  Health Maintenance - HM due items addressed Yes - Last pap smear  Diagnosis  Date Value Ref Range Status  07/21/2019   Final   - Negative for intraepithelial lesion or malignancy (NILM)  Reviewed pap due 07/2022 -Breast Cancer screening indicated? No.   7. Chronic  Disease/Pregnancy Condition follow up:  - PCP referral placed  Amber Donovan, [place] for [employer], [place]

## 2022-03-12 ENCOUNTER — Ambulatory Visit (INDEPENDENT_AMBULATORY_CARE_PROVIDER_SITE_OTHER): Payer: Medicaid Other | Admitting: Obstetrics and Gynecology

## 2022-03-12 ENCOUNTER — Encounter: Payer: Self-pay | Admitting: Obstetrics and Gynecology

## 2022-03-12 DIAGNOSIS — R32 Unspecified urinary incontinence: Secondary | ICD-10-CM

## 2022-03-12 DIAGNOSIS — Z758 Other problems related to medical facilities and other health care: Secondary | ICD-10-CM | POA: Diagnosis not present

## 2022-04-18 ENCOUNTER — Encounter: Payer: Self-pay | Admitting: Family Medicine

## 2022-04-18 ENCOUNTER — Ambulatory Visit: Payer: Medicaid Other | Admitting: Physical Therapy

## 2022-04-22 NOTE — Progress Notes (Unsigned)
     Established patient visit   Patient: Amber Donovan   DOB: August 04, 1995   27 y.o. Female  MRN: [id number] Visit Date: 04/23/2022  Today's healthcare provider: Charlton Amor, DO   No chief complaint on file.   SUBJECTIVE   No chief complaint on file.  HPI    Review of Systems     No outpatient medications have been marked as taking for the 04/23/22 encounter (Appointment) with Charlton Amor, DO.    OBJECTIVE    There were no vitals taken for this visit.  Physical Exam   {Show previous labs (optional):23736}    ASSESSMENT & PLAN    Problem List Items Addressed This Visit   None   No follow-ups on file.      No orders of the defined types were placed in this encounter.   No orders of the defined types were placed in this encounter.    Charlton Amor, DO  [facility] Primary Care & Sports Medicine at [facility] [phone] (phone) [phone] (fax)  [facility]

## 2022-04-23 ENCOUNTER — Encounter: Payer: Self-pay | Admitting: Family Medicine

## 2022-04-23 ENCOUNTER — Ambulatory Visit: Payer: Medicaid Other | Admitting: Family Medicine

## 2022-04-23 VITALS — BP 95/64 | HR 82 | Temp 98.2°F | Ht 64.0 in | Wt 166.0 lb

## 2022-04-23 DIAGNOSIS — R251 Tremor, unspecified: Secondary | ICD-10-CM

## 2022-04-23 DIAGNOSIS — R631 Polydipsia: Secondary | ICD-10-CM

## 2022-04-23 DIAGNOSIS — J302 Other seasonal allergic rhinitis: Secondary | ICD-10-CM

## 2022-04-23 DIAGNOSIS — E559 Vitamin D deficiency, unspecified: Secondary | ICD-10-CM

## 2022-04-23 DIAGNOSIS — R5383 Other fatigue: Secondary | ICD-10-CM | POA: Diagnosis not present

## 2022-04-23 DIAGNOSIS — R41840 Attention and concentration deficit: Secondary | ICD-10-CM | POA: Diagnosis not present

## 2022-04-23 DIAGNOSIS — E538 Deficiency of other specified B group vitamins: Secondary | ICD-10-CM

## 2022-04-23 DIAGNOSIS — R208 Other disturbances of skin sensation: Secondary | ICD-10-CM | POA: Insufficient documentation

## 2022-04-23 DIAGNOSIS — Z7689 Persons encountering health services in other specified circumstances: Secondary | ICD-10-CM | POA: Diagnosis not present

## 2022-04-23 MED ORDER — LEVOCETIRIZINE DIHYDROCHLORIDE 5 MG PO TABS: 5.0000 mg | ORAL_TABLET | Freq: Every evening | ORAL | 0 refills | Status: DC

## 2022-04-23 NOTE — Assessment & Plan Note (Signed)
-   have ordered A1c level

## 2022-04-23 NOTE — Assessment & Plan Note (Addendum)
-   pt has concerns of concentration problems. I am wondering if this is related to ADHD - have sent referral for adhd testing

## 2022-04-23 NOTE — Assessment & Plan Note (Signed)
Pt notes fatigue at the end of the day as well as being abnormally tired per her report. She is two weeks post partum. Will go ahead and order TSH, Vit D, B12 levels and CMP to evaluate.  - pt has hx of vit d and b12 deficiency

## 2022-04-23 NOTE — Assessment & Plan Note (Signed)
-   pt notes seeing her girls screaming and says their screams are yellow swirls  - she also notes heightened symptoms with sounds and different textures. We did discuss it sounds like she has some symptoms of possible autism and we could consider getting her tested. She said she never really went to a doctor when she was younger and was home schooled and her parents did not want people knowing things about her and her sister.

## 2022-04-24 LAB — COMPLETE METABOLIC PANEL WITH GFR
AG Ratio: 1.8 (calc) (ref 1.0–2.5)
ALT: 14 U/L (ref 6–29)
AST: 14 U/L (ref 10–30)
Albumin: 4.9 g/dL (ref 3.6–5.1)
Alkaline phosphatase (APISO): 51 U/L (ref 31–125)
BUN: 20 mg/dL (ref 7–25)
CO2: 26 mmol/L (ref 20–32)
Calcium: 9.8 mg/dL (ref 8.6–10.2)
Chloride: 104 mmol/L (ref 98–110)
Creat: 0.77 mg/dL (ref 0.50–0.96)
Globulin: 2.7 g/dL (calc) (ref 1.9–3.7)
Glucose, Bld: 76 mg/dL (ref 65–99)
Potassium: 4.5 mmol/L (ref 3.5–5.3)
Sodium: 141 mmol/L (ref 135–146)
Total Bilirubin: 0.5 mg/dL (ref 0.2–1.2)
Total Protein: 7.6 g/dL (ref 6.1–8.1)
eGFR: 108 mL/min/{1.73_m2} (ref >=60)

## 2022-04-24 LAB — HEMOGLOBIN A1C
Hgb A1c MFr Bld: 4.9 % of total Hgb (ref <5.7)
Mean Plasma Glucose: 94 mg/dL
eAG (mmol/L): 5.2 mmol/L

## 2022-04-24 LAB — TSH+FREE T4: TSH W/REFLEX TO FT4: 1.06 mIU/L

## 2022-04-24 LAB — IRON,TIBC AND FERRITIN PANEL
%SAT: 22 % (calc) (ref 16–45)
Ferritin: 12 ng/mL — ABNORMAL LOW (ref 16–154)
Iron: 76 ug/dL (ref 40–190)
TIBC: 343 mcg/dL (calc) (ref 250–450)

## 2022-04-24 LAB — VITAMIN D 25 HYDROXY (VIT D DEFICIENCY, FRACTURES): Vit D, 25-Hydroxy: 31 ng/mL (ref 30–100)

## 2022-04-24 LAB — VITAMIN B12: Vitamin B-12: 502 pg/mL (ref 200–1100)

## 2022-05-07 ENCOUNTER — Encounter: Payer: Self-pay | Admitting: Family Medicine

## 2022-06-06 ENCOUNTER — Ambulatory Visit: Payer: Medicaid Other | Admitting: Physical Therapy

## 2022-07-05 ENCOUNTER — Telehealth (INDEPENDENT_AMBULATORY_CARE_PROVIDER_SITE_OTHER): Payer: Medicaid Other | Admitting: Family Medicine

## 2022-07-05 ENCOUNTER — Encounter: Payer: Self-pay | Admitting: Family Medicine

## 2022-07-05 DIAGNOSIS — F4310 Post-traumatic stress disorder, unspecified: Secondary | ICD-10-CM | POA: Diagnosis not present

## 2022-07-05 DIAGNOSIS — F439 Reaction to severe stress, unspecified: Secondary | ICD-10-CM | POA: Diagnosis not present

## 2022-07-05 DIAGNOSIS — R41840 Attention and concentration deficit: Secondary | ICD-10-CM | POA: Diagnosis not present

## 2022-07-05 NOTE — Assessment & Plan Note (Signed)
-   discussed neurology wants her to be seen by behavioral health first to assess for adhd and then discuss tremor
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
## 2022-07-10 ENCOUNTER — Encounter: Payer: Self-pay | Admitting: Family Medicine

## 2022-07-11 ENCOUNTER — Encounter: Payer: Self-pay | Admitting: Family Medicine

## 2022-07-11 ENCOUNTER — Telehealth (INDEPENDENT_AMBULATORY_CARE_PROVIDER_SITE_OTHER): Payer: Medicaid Other | Admitting: Family Medicine

## 2022-07-11 VITALS — Wt 166.0 lb

## 2022-07-11 DIAGNOSIS — F419 Anxiety disorder, unspecified: Secondary | ICD-10-CM

## 2022-07-11 DIAGNOSIS — F4312 Post-traumatic stress disorder, chronic: Secondary | ICD-10-CM | POA: Diagnosis not present

## 2022-07-11 DIAGNOSIS — F4310 Post-traumatic stress disorder, unspecified: Secondary | ICD-10-CM

## 2022-07-11 DIAGNOSIS — F411 Generalized anxiety disorder: Secondary | ICD-10-CM | POA: Diagnosis not present

## 2022-07-11 NOTE — Assessment & Plan Note (Signed)
Pt is meeting with [facility] to discuss - we discussed trauma suppression of memories - flashbacks happen randomly and throughout the day so using something like prazosin for ptsd nightmares may not be beneficial

## 2022-07-11 NOTE — Assessment & Plan Note (Signed)
-   pt handling this well - is seeking out counseling - would like to hold off on medicine

## 2022-07-11 NOTE — Progress Notes (Signed)
     Established patient visit   Patient: Amber Donovan   DOB: 1995-04-21   26 y.o. Female  MRN: [id number] Visit Date: 07/11/2022  Today's healthcare provider: Charlton Amor, DO   Chief Complaint  Patient presents with   Follow-up    Pt states she went to [facility] last thrus and was diagnosed with PTSD and is still waiting on [facility] appt which is today at 10am    SUBJECTIVE    Chief Complaint  Patient presents with   Follow-up    Pt states she went to [facility] last thrus and was diagnosed with PTSD and is still waiting on [facility] appt which is today at 10am   HPI  I connected with  Amber Donovan on 07/11/22 by a video and audio enabled telemedicine application and verified that I am speaking with the correct person using two identifiers.  Patient Location: Home  Provider Location: Office/Clinic  I discussed the limitations of evaluation and management by telemedicine. The patient expressed understanding and agreed to proceed.  Pt presents to discuss her diagnosis of PTSD and depression. She said she is getting more flashbacks that are helping her.   Review of Systems  Constitutional:  Negative for activity change, fatigue and fever.  Respiratory:  Negative for cough and shortness of breath.   Cardiovascular:  Negative for chest pain.  Gastrointestinal:  Negative for abdominal pain.  Genitourinary:  Negative for difficulty urinating.  Psychiatric/Behavioral:  Negative for self-injury and suicidal ideas.        No outpatient medications have been marked as taking for the 07/11/22 encounter (Video Visit) with Charlton Amor, DO.    OBJECTIVE    Wt 166 lb 0.6 oz (75.3 kg)   BMI 28.50 kg/m   Physical Exam Vitals reviewed.  Constitutional:      Appearance: She is well-developed.  HENT:     Head: Normocephalic and atraumatic.  Eyes:     Conjunctiva/sclera: Conjunctivae normal.  Cardiovascular:     Rate and Rhythm: Normal rate.  Pulmonary:     Effort:  Pulmonary effort is normal.  Skin:    General: Skin is dry.     Coloration: Skin is not pale.  Neurological:     Mental Status: She is alert and oriented to person, place, and time.  Psychiatric:        Behavior: Behavior normal.          ASSESSMENT & PLAN    Problem List Items Addressed This Visit       Other   Anxiety    - pt handling this well - is seeking out counseling - would like to hold off on medicine      PTSD (post-traumatic stress disorder) - Primary    Pt is meeting with [facility] to discuss - we discussed trauma suppression of memories - flashbacks happen randomly and throughout the day so using something like prazosin for ptsd nightmares may not be beneficial            No orders of the defined types were placed in this encounter.   No orders of the defined types were placed in this encounter.    Charlton Amor, DO  [facility] Primary Care & Sports Medicine at [facility] [phone] (phone) [phone] (fax)  [facility]

## 2022-07-18 DIAGNOSIS — F4312 Post-traumatic stress disorder, chronic: Secondary | ICD-10-CM | POA: Diagnosis not present

## 2022-07-18 DIAGNOSIS — F411 Generalized anxiety disorder: Secondary | ICD-10-CM | POA: Diagnosis not present

## 2022-07-25 DIAGNOSIS — F4312 Post-traumatic stress disorder, chronic: Secondary | ICD-10-CM | POA: Diagnosis not present

## 2022-07-25 DIAGNOSIS — F411 Generalized anxiety disorder: Secondary | ICD-10-CM | POA: Diagnosis not present

## 2022-08-03 DIAGNOSIS — F4312 Post-traumatic stress disorder, chronic: Secondary | ICD-10-CM | POA: Diagnosis not present

## 2022-08-06 DIAGNOSIS — F902 Attention-deficit hyperactivity disorder, combined type: Secondary | ICD-10-CM | POA: Diagnosis not present

## 2022-08-06 DIAGNOSIS — F411 Generalized anxiety disorder: Secondary | ICD-10-CM | POA: Diagnosis not present

## 2022-08-06 DIAGNOSIS — F4312 Post-traumatic stress disorder, chronic: Secondary | ICD-10-CM | POA: Diagnosis not present

## 2022-08-07 ENCOUNTER — Other Ambulatory Visit: Payer: Self-pay | Admitting: Family Medicine

## 2022-08-07 ENCOUNTER — Encounter: Payer: Self-pay | Admitting: Family Medicine

## 2022-08-07 DIAGNOSIS — R251 Tremor, unspecified: Secondary | ICD-10-CM

## 2022-08-27 DIAGNOSIS — F411 Generalized anxiety disorder: Secondary | ICD-10-CM | POA: Diagnosis not present

## 2022-08-29 ENCOUNTER — Encounter: Payer: Self-pay | Admitting: Family Medicine

## 2022-09-15 ENCOUNTER — Encounter: Payer: Self-pay | Admitting: Cardiology

## 2022-09-26 DIAGNOSIS — J069 Acute upper respiratory infection, unspecified: Secondary | ICD-10-CM | POA: Diagnosis not present

## 2022-09-26 DIAGNOSIS — Z20822 Contact with and (suspected) exposure to covid-19: Secondary | ICD-10-CM | POA: Diagnosis not present

## 2022-09-27 DIAGNOSIS — F902 Attention-deficit hyperactivity disorder, combined type: Secondary | ICD-10-CM | POA: Diagnosis not present

## 2022-09-27 DIAGNOSIS — F411 Generalized anxiety disorder: Secondary | ICD-10-CM | POA: Diagnosis not present

## 2022-09-27 DIAGNOSIS — F4312 Post-traumatic stress disorder, chronic: Secondary | ICD-10-CM | POA: Diagnosis not present

## 2022-09-27 NOTE — Telephone Encounter (Signed)
Spoke with pt and confirmed she can do virtual appointment on 10/1 at 10am. Appt booked. Pt had no further questions at this time.

## 2022-10-04 ENCOUNTER — Encounter (INDEPENDENT_AMBULATORY_CARE_PROVIDER_SITE_OTHER): Payer: Medicaid Other | Admitting: Family Medicine

## 2022-10-04 DIAGNOSIS — L608 Other nail disorders: Secondary | ICD-10-CM | POA: Diagnosis not present

## 2022-10-04 DIAGNOSIS — K029 Dental caries, unspecified: Secondary | ICD-10-CM | POA: Diagnosis not present

## 2022-10-08 NOTE — Telephone Encounter (Signed)
Please see the MyChart message reply(ies) for my assessment and plan.    This patient gave consent for this Medical Advice Message and is aware that it may result in a bill to [employer], as well as the possibility of receiving a bill for a co-payment or deductible. They are an established patient, but are not seeking medical advice exclusively about a problem treated during an in person or video visit in the last seven days. I did not recommend an in person or video visit within seven days of my reply.    I spent a total of 12 minutes cumulative time within 7 days through [employer].  Charlton Amor, DO

## 2022-10-09 ENCOUNTER — Ambulatory Visit: Payer: Medicaid Other | Admitting: Cardiology

## 2022-10-10 ENCOUNTER — Encounter: Payer: Self-pay | Admitting: Cardiology

## 2022-10-10 ENCOUNTER — Telehealth: Payer: Self-pay

## 2022-10-10 ENCOUNTER — Ambulatory Visit: Payer: Medicaid Other | Attending: Cardiology | Admitting: Cardiology

## 2022-10-10 VITALS — Ht 64.0 in | Wt 155.0 lb

## 2022-10-10 DIAGNOSIS — F53 Postpartum depression: Secondary | ICD-10-CM

## 2022-10-10 NOTE — Progress Notes (Signed)
Virtual Visit via Video Note   Because of Amber Donovan's co-morbid illnesses, she is at least at moderate risk for complications without adequate follow up.  This format is felt to be most appropriate for this patient at this time.  All issues noted in this document were discussed and addressed.  A limited physical exam was performed with this format.  Please refer to the patient's chart for her consent to telehealth for [name].      Date:  10/10/2022   ID:  Amber Donovan, DOB April 16, 1995, MRN [id number]  Patient Location: Home Provider Location: Office/Clinic  PCP:  Charlton Amor, DO  Cardiologist:  Thomasene Ripple, DO  Electrophysiologist:  None   Evaluation Performed:  Follow-Up Visit  Chief Complaint:  " I am ok"  History of Present Illness:    Amber Donovan is a 27 y.o. female with postpartum depression, asthma, GERD here today for follow-up visit.  Last saw the patient October 2023 at that time she was experiencing palpitation we will get an echocardiogram as well as ZIO monitor.  Testing was normal.  She reports that her diastolic blood pressure is consistently in the sixties, and her systolic blood pressure is between ninety and a hundred. She has been managing her postpartum depression and ADHD through therapy and spiritual deliverance, which she reports has been successful in alleviating her symptoms. She also mentions that she has been dealing with generational demons, which she believes have been affecting her mental and physical health.  She is here for follow-up visit.  She tells me she is doing well.  She offers no complaints at this time.   The patient does not have symptoms concerning for COVID-19 infection (fever, chills, cough, or new shortness of breath).    Past Medical History:  Diagnosis Date   Allergy 05/2001   Asthma    GERD (gastroesophageal reflux disease)    Past Surgical History:  Procedure Laterality Date   CHOLECYSTECTOMY      TUBAL LIGATION N/A 01/29/2022   Procedure: POST PARTUM TUBAL LIGATION;  Surgeon: Venora Maples, MD;  Location: [facility];  Service: Gynecology;  Laterality: N/A;   WISDOM TOOTH EXTRACTION       Current Meds  Medication Sig   levocetirizine (XYZAL ALLERGY 24HR) 5 MG tablet Take 1 tablet (5 mg total) by mouth every evening. (Patient taking differently: Take 5 mg by mouth as needed.)     Allergies:   Hydrocodone, Hydrocodone-acetaminophen, Peanut-containing drug products, and Sunflower oil   Social History   Tobacco Use   Smoking status: Never   Smokeless tobacco: Never  Vaping Use   Vaping status: Never Used  Substance Use Topics   Alcohol use: Never   Drug use: Never     Family Hx: The patient's family history includes Diabetes in her father, mother, and paternal grandmother; Hodgkin's lymphoma in her paternal grandfather; Obesity in her mother; Prostate cancer in her maternal grandfather; Vision loss in her father.  ROS:   Please see the history of present illness.     All other systems reviewed and are negative.   Prior CV studies:   The following studies were reviewed today:  Echo and ZIO monitor  Labs/Other Tests and Data Reviewed:    EKG:  No ECG reviewed.  Recent Labs: 12/25/2021: TSH 1.760 01/29/2022: Hemoglobin 11.8; Platelets 237 04/23/2022: ALT 14; BUN 20; Creat 0.77; Potassium 4.5; Sodium 141   Recent Lipid Panel No results found for: "CHOL", "TRIG", "HDL", "  CHOLHDL", "LDLCALC", "LDLDIRECT"  Wt Readings from Last 3 Encounters:  10/10/22 155 lb (70.3 kg)  07/11/22 166 lb 0.6 oz (75.3 kg)  04/23/22 166 lb 0.6 oz (75.3 kg)     Objective:    Vital Signs:  Ht 5\' 4"  (1.626 m)   Wt 155 lb (70.3 kg)   Breastfeeding No   BMI 26.61 kg/m      ASSESSMENT & PLAN:    Blood Pressure Management Patient's systolic blood pressure consistently between 90-100 and diastolic in the 60s. No concerns raised regarding hypertension. -Continue current  management.  Postpartum Depression Patient reported improvement in mental health status through therapy and spiritual practices. No current signs of depression. -Continue current management.  Follow-up As needed basis established due to stable health status. -Contact via MyChart for any concerns or changes in health status.  COVID-19 Education: The signs and symptoms of COVID-19 were discussed with the patient and how to seek care for testing (follow up with PCP or arrange E-visit).  The importance of social distancing was discussed today.  Time:   Today, I have spent 15 minutes with the patient with telehealth technology discussing the above problems.     Medication Adjustments/Labs and Tests Ordered: Current medicines are reviewed at length with the patient today.  Concerns regarding medicines are outlined above.   Tests Ordered: No orders of the defined types were placed in this encounter.   Medication Changes: No orders of the defined types were placed in this encounter.   Follow Up:  In Person in as needed    Osvaldo Shipper, DO  10/10/2022 1:46 PM    [facility]

## 2022-10-10 NOTE — Telephone Encounter (Signed)
  Patient Consent for Virtual Visit        Amber Donovan has provided verbal consent on 10/10/2022 for a virtual visit (video or telephone).   CONSENT FOR VIRTUAL VISIT FOR:  Amber Donovan  By participating in this virtual visit I agree to the following:  I hereby voluntarily request, consent and authorize [facility] and its employed or contracted physicians, physician assistants, nurse practitioners or other licensed health care professionals (the Practitioner), to provide me with telemedicine health care services (the "Services") as deemed necessary by the treating Practitioner. I acknowledge and consent to receive the Services by the Practitioner via telemedicine. I understand that the telemedicine visit will involve communicating with the Practitioner through live audiovisual communication technology and the disclosure of certain medical information by electronic transmission. I acknowledge that I have been given the opportunity to request an in-person assessment or other available alternative prior to the telemedicine visit and am voluntarily participating in the telemedicine visit.  I understand that I have the right to withhold or withdraw my consent to the use of telemedicine in the course of my care at any time, without affecting my right to future care or treatment, and that the Practitioner or I may terminate the telemedicine visit at any time. I understand that I have the right to inspect all information obtained and/or recorded in the course of the telemedicine visit and may receive copies of available information for a reasonable fee.  I understand that some of the potential risks of receiving the Services via telemedicine include:  Delay or interruption in medical evaluation due to technological equipment failure or disruption; Information transmitted may not be sufficient (e.g. poor resolution of images) to allow for appropriate medical decision making by the  Practitioner; and/or  In rare instances, security protocols could fail, causing a breach of personal health information.  Furthermore, I acknowledge that it is my responsibility to provide information about my medical history, conditions and care that is complete and accurate to the best of my ability. I acknowledge that Practitioner's advice, recommendations, and/or decision may be based on factors not within their control, such as incomplete or inaccurate data provided by me or distortions of diagnostic images or specimens that may result from electronic transmissions. I understand that the practice of medicine is not an exact science and that Practitioner makes no warranties or guarantees regarding treatment outcomes. I acknowledge that a copy of this consent can be made available to me via my patient portal [facility] MyChart), or I can request a printed copy by calling the office of [facility].    I understand that my insurance will be billed for this visit.   I have read or had this consent read to me. I understand the contents of this consent, which adequately explains the benefits and risks of the Services being provided via telemedicine.  I have been provided ample opportunity to ask questions regarding this consent and the Services and have had my questions answered to my satisfaction. I give my informed consent for the services to be provided through the use of telemedicine in my medical care

## 2022-10-10 NOTE — Patient Instructions (Signed)
Medication Instructions:  Your physician recommends that you continue on your current medications as directed. Please refer to the Current Medication list given to you today.  *If you need a refill on your cardiac medications before your next appointment, please call your pharmacy*   Lab Work: None   Testing/Procedures: None   Follow-Up: At [facility], you and your health needs are our priority.  As part of our continuing mission to provide you with exceptional heart care, we have created designated Provider Care Teams.  These Care Teams include your primary Cardiologist (physician) and Advanced Practice Providers (APPs -  Physician Assistants and Nurse Practitioners) who all work together to provide you with the care you need, when you need it.  Your next appointment:    As needed  Provider:   Kardie Tobb, DO     

## 2022-10-17 ENCOUNTER — Ambulatory Visit: Payer: Medicaid Other | Admitting: Family Medicine

## 2022-10-24 ENCOUNTER — Other Ambulatory Visit: Payer: Self-pay

## 2022-10-24 ENCOUNTER — Encounter: Payer: Self-pay | Admitting: Family Medicine

## 2022-10-24 ENCOUNTER — Ambulatory Visit
Admission: EM | Admit: 2022-10-24 | Discharge: 2022-10-24 | Disposition: A | Discharge status: Home / Self Care | Payer: Medicaid Other | Attending: Family Medicine | Admitting: Family Medicine

## 2022-10-24 DIAGNOSIS — H6993 Unspecified Eustachian tube disorder, bilateral: Secondary | ICD-10-CM | POA: Diagnosis not present

## 2022-10-24 MED ORDER — FLUTICASONE PROPIONATE 50 MCG/ACT NA SUSP: 2.0000 | Freq: Every day | NASAL | 0 refills | Status: DC

## 2022-10-24 NOTE — Discharge Instructions (Signed)
Make sure you drink lots of fluids Use Flonase ad directed See Dr Alverda Skeans in follow up

## 2022-10-24 NOTE — ED Triage Notes (Addendum)
Day before yesterday had vomiting, feeling tired. Today has fluid in both ears and has diarrhea. Took antibiotic she had left over.

## 2022-10-24 NOTE — ED Provider Notes (Signed)
[facility]    CSN: [id number] Arrival date & time: 10/24/22  1528      History   Chief Complaint Chief Complaint  Patient presents with   Diarrhea   Ear Fullness    HPI Amber Donovan is a 27 y.o. female.   Patient is here with her 11 young daughters complaining of ear popping and pressure.  She had some vomiting and diarrhea the last couple of days, but this is resolved.  She is mostly concerned about having an ear infection because 2 of her children have had them in the last month.  She has no runny nose or cough.  No fever or chills.    Past Medical History:  Diagnosis Date   Allergy 05/2001   Asthma    GERD (gastroesophageal reflux disease)     Patient Active Problem List   Diagnosis Date Noted   Anxiety 07/11/2022   PTSD (post-traumatic stress disorder) 07/11/2022   Stress 07/05/2022   Other fatigue 04/23/2022   Concentration deficit 04/23/2022   Polydipsia 04/23/2022   Synesthesia 04/23/2022    Past Surgical History:  Procedure Laterality Date   CHOLECYSTECTOMY     TUBAL LIGATION N/A 01/29/2022   Procedure: POST PARTUM TUBAL LIGATION;  Surgeon: Venora Maples, MD;  Location: [facility] LD [facility]S;  Service: Gynecology;  Laterality: N/A;   WISDOM TOOTH EXTRACTION      OB History     Gravida  4   Para  4   Term  4   Preterm      AB      Living  4      SAB      IAB      Ectopic      Multiple  0   Live Births  4            Home Medications    Prior to Admission medications   Medication Sig Start Date End Date Taking? Authorizing Provider  fluticasone (FLONASE) 50 MCG/ACT nasal spray Place 2 sprays into both nostrils daily. 10/24/22  Yes Eustace Moore, MD    Family History Family History  Problem Relation Age of Onset   Diabetes Mother    Obesity Mother    Diabetes Father    Vision loss Father    Prostate cancer Maternal Grandfather    Diabetes Paternal Grandmother    Hodgkin's lymphoma Paternal Grandfather      Social History Social History   Tobacco Use   Smoking status: Never   Smokeless tobacco: Never  Vaping Use   Vaping status: Never Used  Substance Use Topics   Alcohol use: Never   Drug use: Never     Allergies   Hydrocodone, Hydrocodone-acetaminophen, and Sunflower oil   Review of Systems Review of Systems See HPI  Physical Exam Triage Vital Signs ED Triage Vitals [10/24/22 1554]  Encounter Vitals Group     BP 90/61     Systolic BP Percentile      Diastolic BP Percentile      Pulse Rate (!) 123     Resp 16     Temp 98.1 F (36.7 C)     Temp Source Oral     SpO2 99 %     Weight      Height      Head Circumference      Peak Flow      Pain Score      Pain Loc      Pain  Education      Exclude from Growth Chart    No data found.  Updated Vital Signs BP 90/61   Pulse (!) 123   Temp 98.1 F (36.7 C) (Oral)   Resp 16   SpO2 99%       Physical Exam Constitutional:      General: She is not in acute distress.    Appearance: Normal appearance. She is well-developed and normal weight.  HENT:     Head: Normocephalic and atraumatic.     Right Ear: Tympanic membrane and ear canal normal.     Left Ear: Tympanic membrane and ear canal normal.     Nose: Nose normal. No congestion.     Mouth/Throat:     Pharynx: No posterior oropharyngeal erythema.  Eyes:     Conjunctiva/sclera: Conjunctivae normal.     Pupils: Pupils are equal, round, and reactive to light.  Cardiovascular:     Rate and Rhythm: Normal rate.  Pulmonary:     Effort: Pulmonary effort is normal. No respiratory distress.  Abdominal:     General: There is no distension.     Palpations: Abdomen is soft.  Musculoskeletal:        General: Normal range of motion.     Cervical back: Normal range of motion.  Skin:    General: Skin is warm and dry.  Neurological:     Mental Status: She is alert.      UC Treatments / Results  Labs (all labs ordered are listed, but only abnormal results  are displayed) Labs Reviewed - No data to display  EKG   Radiology No results found.  Procedures Procedures (including critical care time)  Medications Ordered in UC Medications - No data to display  Initial Impression / Assessment and Plan / UC Course  I have reviewed the triage vital signs and the nursing notes.  Pertinent labs & imaging results that were available during my care of the patient were reviewed by me and considered in my medical decision making (see chart for details).     Final Clinical Impressions(s) / UC Diagnoses   Final diagnoses:  Eustachian tube dysfunction, bilateral     Discharge Instructions      Make sure you drink lots of fluids Use Flonase ad directed See Dr Alverda Skeans in follow up   ED Prescriptions     Medication Sig Dispense Auth. Provider   fluticasone (FLONASE) 50 MCG/ACT nasal spray Place 2 sprays into both nostrils daily. 16 g Eustace Moore, MD      PDMP not reviewed this encounter.   Eustace Moore, MD 10/24/22 [phone]

## 2022-11-02 ENCOUNTER — Ambulatory Visit: Payer: Medicaid Other | Admitting: Diagnostic Neuroimaging

## 2022-11-05 ENCOUNTER — Ambulatory Visit: Payer: Medicaid Other | Admitting: Neurology

## 2022-11-06 ENCOUNTER — Ambulatory Visit: Payer: Medicaid Other | Admitting: Neurology

## 2022-11-29 ENCOUNTER — Encounter: Payer: Self-pay | Admitting: Family Medicine

## 2022-12-13 ENCOUNTER — Other Ambulatory Visit: Payer: Self-pay | Admitting: Family Medicine

## 2022-12-13 ENCOUNTER — Ambulatory Visit (INDEPENDENT_AMBULATORY_CARE_PROVIDER_SITE_OTHER): Payer: Medicaid Other | Admitting: Family Medicine

## 2022-12-13 ENCOUNTER — Ambulatory Visit: Payer: Medicaid Other

## 2022-12-13 VITALS — BP 111/57 | HR 64 | Ht 64.0 in | Wt 151.8 lb

## 2022-12-13 DIAGNOSIS — G8929 Other chronic pain: Secondary | ICD-10-CM | POA: Diagnosis not present

## 2022-12-13 DIAGNOSIS — M238X2 Other internal derangements of left knee: Secondary | ICD-10-CM

## 2022-12-13 DIAGNOSIS — B351 Tinea unguium: Secondary | ICD-10-CM

## 2022-12-13 DIAGNOSIS — J302 Other seasonal allergic rhinitis: Secondary | ICD-10-CM | POA: Diagnosis not present

## 2022-12-13 DIAGNOSIS — M238X1 Other internal derangements of right knee: Secondary | ICD-10-CM

## 2022-12-13 DIAGNOSIS — M25561 Pain in right knee: Secondary | ICD-10-CM | POA: Diagnosis not present

## 2022-12-13 DIAGNOSIS — M25562 Pain in left knee: Secondary | ICD-10-CM

## 2022-12-13 MED ORDER — CICLOPIROX 8 % EX SOLN: Freq: Every day | CUTANEOUS | 0 refills | Status: DC

## 2022-12-13 MED ORDER — FLUTICASONE PROPIONATE 50 MCG/ACT NA SUSP: 2.0000 | Freq: Every day | NASAL | 0 refills | Status: DC

## 2022-12-13 MED ORDER — MELOXICAM 15 MG PO TABS: 15.0000 mg | ORAL_TABLET | Freq: Every day | ORAL | 0 refills | Status: DC

## 2022-12-13 MED ORDER — LEVOCETIRIZINE DIHYDROCHLORIDE 5 MG PO TABS: 5.0000 mg | ORAL_TABLET | Freq: Every day | ORAL | 1 refills | Status: DC | PRN

## 2022-12-13 NOTE — Progress Notes (Signed)
Established patient visit   Patient: Amber Donovan   DOB: 1995/02/03   27 y.o. Female  MRN: [id number] Visit Date: 12/13/2022  Today's healthcare provider: Charlton Amor, DO   Chief Complaint  Patient presents with   Medication Refill    SUBJECTIVE    Chief Complaint  Patient presents with   Medication Refill   Medication Refill Pertinent negatives include no abdominal pain, chest pain, coughing, fatigue or fever.    Pt presents with concerns of b/l knee pain in addition to needing refills on her allergy medicine  Review of Systems  Constitutional:  Negative for activity change, fatigue and fever.  Respiratory:  Negative for cough and shortness of breath.   Cardiovascular:  Negative for chest pain.  Gastrointestinal:  Negative for abdominal pain.  Genitourinary:  Negative for difficulty urinating.  Musculoskeletal:        B/l knee pain       Current Meds  Medication Sig   ciclopirox (PENLAC) 8 % solution Apply topically at bedtime. Apply over nail and surrounding skin. Apply daily over previous coat. After seven (7) days, may remove with alcohol and continue cycle.   meloxicam (MOBIC) 15 MG tablet Take 1 tablet (15 mg total) by mouth daily.    OBJECTIVE    BP (!) 111/57 (BP Location: Left Arm, Patient Position: Sitting, Cuff Size: Large)   Pulse 64   Ht 5\' 4"  (1.626 m)   Wt 151 lb 12 oz (68.8 kg)   SpO2 97%   BMI 26.05 kg/m   Physical Exam Vitals and nursing note reviewed.  Constitutional:      General: She is not in acute distress.    Appearance: Normal appearance.  HENT:     Head: Normocephalic and atraumatic.     Right Ear: External ear normal.     Left Ear: External ear normal.     Nose: Nose normal.  Eyes:     Conjunctiva/sclera: Conjunctivae normal.  Cardiovascular:     Rate and Rhythm: Normal rate and regular rhythm.  Pulmonary:     Effort: Pulmonary effort is normal.     Breath sounds: Normal breath sounds.  Musculoskeletal:      Comments: B/l knee crepitus on exam. Tenderness to palpation of knee bilaterally  Neurological:     General: No focal deficit present.     Mental Status: She is alert and oriented to person, place, and time.  Psychiatric:        Mood and Affect: Mood normal.        Behavior: Behavior normal.        Thought Content: Thought content normal.        Judgment: Judgment normal.        ASSESSMENT & PLAN    Problem List Items Addressed This Visit       Musculoskeletal and Integument   Onychomycosis    Of left great toenail. 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 Will go ahead and obtain xrays - given meloxicam for anti-inflammatory purposes  - I anticipate some arthritis based on crepitus and then we will see if patient would be eligible for steroid injections. Also recommended knee stretches and movements  Relevant Medications   meloxicam (MOBIC) 15 MG tablet   Other Relevant Orders   DG Knee Bilateral Standing AP   Other Visit Diagnoses     Seasonal allergies       Relevant Medications   fluticasone (FLONASE) 50 MCG/ACT nasal spray   levocetirizine (XYZAL ALLERGY 24HR) 5 MG tablet   Crepitus of both knee joints       Relevant Orders   DG Knee Bilateral Standing AP       No follow-ups on file.      Meds ordered this encounter  Medications   fluticasone (FLONASE) 50 MCG/ACT nasal spray    Sig: Place 2 sprays into both nostrils daily.    Dispense:  16 g    Refill:  0   levocetirizine (XYZAL ALLERGY 24HR) 5 MG tablet    Sig: Take 1 tablet (5 mg total) by mouth daily as needed for allergies.    Dispense:  90 tablet    Refill:  1   ciclopirox (PENLAC) 8 % solution    Sig: Apply topically at bedtime. Apply over nail and surrounding skin. Apply daily over  previous coat. After seven (7) days, may remove with alcohol and continue cycle.    Dispense:  6.6 mL    Refill:  0   meloxicam (MOBIC) 15 MG tablet    Sig: Take 1 tablet (15 mg total) by mouth daily.    Dispense:  30 tablet    Refill:  0    Orders Placed This Encounter  Procedures   DG Knee Bilateral Standing AP    Standing Status:   Future    Standing Expiration Date:   12/13/2023    Order Specific Question:   Reason for exam:    Answer:   Knee pain.  Please do standing AP, lateral, rosenberg, and merchant views.    Order Specific Question:   Is the patient pregnant?    Answer:   No    Order Specific Question:   Preferred imaging location?    Answer:   [facility]    Order Specific Question:   Release to patient    Answer:   Immediate     Charlton Amor, DO  [facility] at [facility] [phone] (phone) [phone] (fax)  [facility]

## 2022-12-13 NOTE — Assessment & Plan Note (Signed)
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 Will go ahead and obtain xrays - given meloxicam for anti-inflammatory purposes  - I anticipate some arthritis based on crepitus and then we will see if patient would be eligible for steroid injections. Also recommended knee stretches and movements

## 2022-12-13 NOTE — Assessment & Plan Note (Signed)
Of left great toenail. Will go ahead and treat with ciclopirox

## 2022-12-13 NOTE — Patient Instructions (Signed)
Call medicaid for dentist

## 2023-03-11 ENCOUNTER — Ambulatory Visit: Payer: Medicaid Other | Admitting: Neurology

## 2023-04-08 ENCOUNTER — Encounter: Payer: Self-pay | Admitting: Physician Assistant

## 2023-04-08 ENCOUNTER — Telehealth (INDEPENDENT_AMBULATORY_CARE_PROVIDER_SITE_OTHER): Admitting: Physician Assistant

## 2023-04-08 VITALS — Ht 64.0 in | Wt 155.0 lb

## 2023-04-08 DIAGNOSIS — L600 Ingrowing nail: Secondary | ICD-10-CM | POA: Diagnosis not present

## 2023-04-08 DIAGNOSIS — J302 Other seasonal allergic rhinitis: Secondary | ICD-10-CM | POA: Insufficient documentation

## 2023-04-08 DIAGNOSIS — F4310 Post-traumatic stress disorder, unspecified: Secondary | ICD-10-CM

## 2023-04-08 DIAGNOSIS — B351 Tinea unguium: Secondary | ICD-10-CM

## 2023-04-08 DIAGNOSIS — E663 Overweight: Secondary | ICD-10-CM | POA: Diagnosis not present

## 2023-04-08 DIAGNOSIS — G479 Sleep disorder, unspecified: Secondary | ICD-10-CM | POA: Diagnosis not present

## 2023-04-08 DIAGNOSIS — L659 Nonscarring hair loss, unspecified: Secondary | ICD-10-CM

## 2023-04-08 MED ORDER — LEVOCETIRIZINE DIHYDROCHLORIDE 5 MG PO TABS: 5.0000 mg | ORAL_TABLET | Freq: Every day | ORAL | 3 refills | Status: DC | PRN

## 2023-04-08 MED ORDER — FLUTICASONE PROPIONATE 50 MCG/ACT NA SUSP: 2.0000 | Freq: Every day | NASAL | 0 refills | Status: DC

## 2023-04-08 MED ORDER — CICLOPIROX 8 % EX SOLN: Freq: Every day | CUTANEOUS | 1 refills | Status: DC

## 2023-04-08 NOTE — Progress Notes (Unsigned)
..  Virtual Visit via Video Note  I connected with Amber Donovan on 04/08/23 at  3:20 PM EDT by a video enabled telemedicine application and verified that I am speaking with the correct person using two identifiers.  Location: Patient: *** Provider: ***   I discussed the limitations of evaluation and management by telemedicine and the availability of in person appointments. The patient expressed understanding and agreed to proceed.  History of Present Illness:  Sleep time tea     Observations/Objective:   Assessment and Plan:   Follow Up Instructions:    I discussed the assessment and treatment plan with the patient. The patient was provided an opportunity to ask questions and all were answered. The patient agreed with the plan and demonstrated an understanding of the instructions.   The patient was advised to call back or seek an in-person evaluation if the symptoms worsen or if the condition fails to improve as anticipated.   Tandy Gaw, PA-C

## 2023-04-10 ENCOUNTER — Encounter: Payer: Self-pay | Admitting: Physician Assistant

## 2023-04-10 DIAGNOSIS — L659 Nonscarring hair loss, unspecified: Secondary | ICD-10-CM | POA: Insufficient documentation

## 2023-04-30 ENCOUNTER — Encounter: Payer: Self-pay | Admitting: Podiatry

## 2023-05-01 ENCOUNTER — Encounter: Payer: Self-pay | Admitting: Physician Assistant

## 2023-05-01 ENCOUNTER — Ambulatory Visit: Payer: Self-pay

## 2023-05-01 MED ORDER — DOXYCYCLINE HYCLATE 100 MG PO TABS: 100.0000 mg | ORAL_TABLET | Freq: Two times a day (BID) | ORAL | 0 refills | Status: DC

## 2023-05-01 NOTE — Telephone Encounter (Signed)
 Patient scheduled 05/02/23 with Joy Jessup,NP

## 2023-05-01 NOTE — Telephone Encounter (Signed)
  Chief Complaint: ingrown toenail, possibly infected Symptoms: pain, swelling, redness Frequency: started about 5 days ago Pertinent Negatives: Patient denies fever, red streaking, DM 2 Disposition: [] ED /[] Urgent Care (no appt availability in office) / [x] Appointment(In office/virtual)/ []  [facility]/ [] Home Care/ [] Refused Recommended Disposition /[] [facility]/ []  Follow-up with PCP Additional Notes: Pt states that she has a hx of ingrown toenails. Pt states that she attempted to get the portion of the nail out but states that she can still see a flap of skin. + pus, + pain, + swelling. Pt states that she has a podiatry appt 4/25. Pt states that she is also feeling achy and is worried the infection has spread. Pt scheduled for tomorrow at PCP office. Pt advised to keep toes clean, dry and covered with AAA ointment. Pt also advised to keep foot elevated when not working. Pt does not have a thermometer with her, advised that she does not feel as though she has a fever, just "achy". Denies DM2.  Copied from CRM [id number]. Topic: Clinical - Red Word Triage >> May 01, 2023  3:46 PM Jayson Michael wrote: [facility] that prompted transfer to Nurse Triage: toe infection  Patient states they have an upcoming appointment this Friday for their toe but are now feeling achy, fatigued, and having increased toe pain. Concerned that the infection may be worsening and is requesting antibiotics be sent to [facility] pharmacy for pickup.  She sent a mychart message earlier with no response patient is not worried and seeking advice Reason for Disposition  Entire toe is swollen  Answer Assessment - Initial Assessment Questions 1. LOCATION: "Which toe?"      R great toe  2. APPEARANCE: "What does it look like?"      Red, swollen, pus 3. ONSET: "When did it start?"      About 5 days ago 4. PAIN: "Is there any pain?" If Yes, ask: "How bad is the pain?"   (Scale 1-10; or mild, moderate, severe)    -  NONE (0): none     - MILD (1-3): doesn't interfere with normal activities    - MODERATE (4-7): interferes with normal activities or awakens from sleep    - SEVERE (8-10): excruciating pain, unable to do any normal activities     6 5. REDNESS: "Is there any redness of the skin?" If Yes, ask: "How much of the toe is red?"     Localized to the area that is ingrown 6. OTHER SYMPTOMS: "Do you have any other symptoms?" (e.g., chills, fever, red streak up foot)     Does not have a thermometer, states that she does not feel feverish 7. PREGNANCY: "Is there any chance you are pregnant?" "When was your last menstrual period?"     denies  Protocols used: Toenail - Ingrown-A-AH

## 2023-05-02 ENCOUNTER — Ambulatory Visit: Admitting: Medical-Surgical

## 2023-05-03 ENCOUNTER — Ambulatory Visit: Admitting: Podiatry

## 2023-05-03 ENCOUNTER — Encounter: Payer: Self-pay | Admitting: Podiatry

## 2023-05-03 DIAGNOSIS — L600 Ingrowing nail: Secondary | ICD-10-CM | POA: Diagnosis not present

## 2023-05-03 NOTE — Patient Instructions (Signed)

## 2023-05-03 NOTE — Progress Notes (Addendum)
  Subjective:  Patient ID: Amber Donovan, female    DOB: 03-21-1995,   MRN: [id number]  No chief complaint on file.   28 y.o. female presents for concern of ingrown toenail on the right great toe. Relates this has been a chronic issues and has had it removed in the past but continued to be a problem. She did have some antibiotics called in for her but has not taken yet.  She also has a bunion but relates this has not been much of an issue for her.  . Denies any other pedal complaints. Denies n/v/f/c.   Past Medical History:  Diagnosis Date   Allergy 05/2001   Asthma    GERD (gastroesophageal reflux disease)     Objective:  Physical Exam: Vascular: DP/PT pulses 2/4 bilateral. CFT <3 seconds. Normal hair growth on digits. No edema.  Skin. No lacerations or abrasions bilateral feet. Incurvation of lateral border or right hallux with granular tissue purulence erythema and edema  Musculoskeletal: MMT 5/5 bilateral lower extremities in DF, PF, Inversion and Eversion. Deceased ROM in DF of ankle joint.  Neurological: Sensation intact to light touch.   Assessment:   1. Ingrown right greater toenail      Plan:  Patient was evaluated and treated and all questions answered. Discussed ingrown toenails etiology and treatment options including procedure for removal vs conservative care.  Patient requesting removal of ingrown nail today. Procedure below.  Discussed procedure and post procedure care and patient expressed understanding.  Will follow-up in 2 weeks for nail check or sooner if any problems arise.    Procedure:  Procedure: partial Nail Avulsion of right hallux lateral nail border.  Surgeon: Jennefer Moats, DPM  Pre-op Dx: Ingrown toenail with infection Post-op: Same  Place of Surgery: Office exam room.  Indications for surgery: Painful and ingrown toenail.    The patient is requesting removal of nail without  chemical matrixectomy. Risks and complications were discussed with  the patient for which they understand and written consent was obtained. Under sterile conditions a total of 3 mL of  1% lidocaine  plain was infiltrated in a hallux block fashion. Once anesthetized, the skin was prepped in sterile fashion. A tourniquet was then applied. Next the lateral border was removed and the area copiously irrigated. Silvadene was applied. A dry sterile dressing was applied. After application of the dressing the tourniquet was removed and there is found to be an immediate capillary refill time to the digit. The patient tolerated the procedure well without any complications. Post procedure instructions were discussed the patient for which he verbally understood. Follow-up in two weeks for nail check or sooner if any problems are to arise. Discussed signs/symptoms of infection and directed to call the office immediately should any occur or go directly to the emergency room. In the meantime, encouraged to call the office with any questions, concerns, changes symptoms.   Jennefer Moats, DPM

## 2023-05-16 ENCOUNTER — Encounter: Payer: Self-pay | Admitting: Family Medicine

## 2023-05-17 ENCOUNTER — Ambulatory Visit: Admitting: Podiatry

## 2023-06-14 ENCOUNTER — Other Ambulatory Visit: Payer: Self-pay | Admitting: Podiatry

## 2023-06-14 ENCOUNTER — Ambulatory Visit (INDEPENDENT_AMBULATORY_CARE_PROVIDER_SITE_OTHER): Admitting: Podiatry

## 2023-06-14 ENCOUNTER — Telehealth: Payer: Self-pay | Admitting: *Deleted

## 2023-06-14 DIAGNOSIS — Z91199 Patient's noncompliance with other medical treatment and regimen due to unspecified reason: Secondary | ICD-10-CM

## 2023-06-14 DIAGNOSIS — M2011 Hallux valgus (acquired), right foot: Secondary | ICD-10-CM

## 2023-06-14 NOTE — Progress Notes (Signed)
 No show

## 2023-06-19 ENCOUNTER — Encounter: Payer: Self-pay | Admitting: Physician Assistant

## 2023-06-19 ENCOUNTER — Ambulatory Visit: Admitting: Physician Assistant

## 2023-06-19 VITALS — BP 120/80 | HR 70 | Ht 64.0 in | Wt 170.0 lb

## 2023-06-19 DIAGNOSIS — F419 Anxiety disorder, unspecified: Secondary | ICD-10-CM

## 2023-06-19 DIAGNOSIS — E663 Overweight: Secondary | ICD-10-CM

## 2023-06-19 DIAGNOSIS — F4010 Social phobia, unspecified: Secondary | ICD-10-CM

## 2023-06-19 DIAGNOSIS — F4310 Post-traumatic stress disorder, unspecified: Secondary | ICD-10-CM

## 2023-06-19 DIAGNOSIS — G479 Sleep disorder, unspecified: Secondary | ICD-10-CM

## 2023-06-19 DIAGNOSIS — R41840 Attention and concentration deficit: Secondary | ICD-10-CM | POA: Diagnosis not present

## 2023-06-19 DIAGNOSIS — F439 Reaction to severe stress, unspecified: Secondary | ICD-10-CM

## 2023-06-19 NOTE — Patient Instructions (Signed)
 Will make referral for testing, talk therapy and weight.
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
## 2023-06-20 ENCOUNTER — Encounter: Payer: Self-pay | Admitting: Physician Assistant

## 2023-06-24 ENCOUNTER — Encounter: Payer: Self-pay | Admitting: Neurology

## 2023-06-24 ENCOUNTER — Ambulatory Visit (INDEPENDENT_AMBULATORY_CARE_PROVIDER_SITE_OTHER): Payer: Medicaid Other | Admitting: Neurology

## 2023-06-24 ENCOUNTER — Telehealth: Payer: Self-pay | Admitting: Physician Assistant

## 2023-06-24 ENCOUNTER — Telehealth: Payer: Self-pay | Admitting: Neurology

## 2023-06-24 VITALS — BP 119/71 | HR 88 | Resp 15 | Ht 64.0 in | Wt 173.4 lb

## 2023-06-24 DIAGNOSIS — F909 Attention-deficit hyperactivity disorder, unspecified type: Secondary | ICD-10-CM | POA: Insufficient documentation

## 2023-06-24 DIAGNOSIS — G4710 Hypersomnia, unspecified: Secondary | ICD-10-CM | POA: Insufficient documentation

## 2023-06-24 DIAGNOSIS — R251 Tremor, unspecified: Secondary | ICD-10-CM | POA: Insufficient documentation

## 2023-06-24 DIAGNOSIS — H539 Unspecified visual disturbance: Secondary | ICD-10-CM | POA: Diagnosis not present

## 2023-06-24 NOTE — Telephone Encounter (Signed)
 VM box full, sent MyChart msg regarding next appt.

## 2023-06-24 NOTE — Telephone Encounter (Signed)
 Copied from CRM [id number]. Topic: Referral - Status >> Jun 24, 2023  4:00 PM Tisa Forester wrote: Reason for CRM: patient asking if she can get another referral for weight management reason the referral she have to pay a starting fee of $99 for the referral for weight management .   Also checking on the status of the referral for Autism Testing have not heard anything  Patient call [phone] >> Jun 24, 2023  4:04 PM Tisa Forester wrote: Checking on status of the referral for the PTSD

## 2023-06-24 NOTE — Progress Notes (Signed)
 Chief Complaint  Patient presents with   New Patient (Initial Visit)    Rm15, alone, NP/internal referral for tremors: bilateral hands since childhood      ASSESSMENT AND PLAN  Amber Donovan is a 28 y.o. female   Bilateral hands tremor  Most consistent with physiological tremor versus underlying essential tremor, normal TSH in April 2024 Visual change,  Would like to proceed with MRI of brain Sleep disorder  Episodes of sudden drifting into sleep and dreaming, worrisome for sleep disorder, referral to sleep study   DIAGNOSTIC DATA (LABS, IMAGING, TESTING) - I reviewed patient records, labs, notes, testing and imaging myself where available.   MEDICAL HISTORY:  Amber Donovan, is a 28 year old female seen in request by her primary care PA Sandy Crumb for evaluation of constellation of complaints, initial evaluation June 24, 2023  History is obtained from the patient and review of electronic medical records. I personally reviewed pertinent available imaging films in PACS.   PMHx of  ADHD Anxiety  She report history of PTSD, anxiety, still have panic attack under a lot of stress, chest constriction, heart palpitation, tremor, dizziness  She also complains of intermittent visual difficulties, especially at nighttime, switch from  dark to bright,vise versa, has difficulty focusing, to the point of eye pain,  She also carries a diagnosis of ADHD, but was never treated  The most concerning symptoms are her episode of sudden falling to sleep, over the past few years, it happened on a weekly basis, even taking a shower, she can certainly drift into dreams in the standing position,  She has gradual worsening bilateral hand action tremor, maternal grandmother has similar tremor    PHYSICAL EXAM:   Vitals:   06/24/23 1524  BP: 119/71  Pulse: 88  Resp: 15  SpO2: 99%  Weight: 173 lb 6.4 oz (78.7 kg)  Height: 5' 4 (1.626 m)    Body mass index is 29.76  kg/m.  PHYSICAL EXAMNIATION:  Gen: NAD, conversant, well nourised, well groomed                     Cardiovascular: Regular rate rhythm, no peripheral edema, warm, nontender. Eyes: Conjunctivae clear without exudates or hemorrhage Neck: Supple, no carotid bruits. Pulmonary: Clear to auscultation bilaterally   NEUROLOGICAL EXAM:  MENTAL STATUS: Speech/cognition: Anxious looking middle-age female, awake, alert, oriented to history taking and casual conversation CRANIAL NERVES: CN II: Visual fields are full to confrontation. Pupils are round equal and briskly reactive to light. CN III, IV, VI: extraocular movement are normal. No ptosis. CN V: Facial sensation is intact to light touch CN VII: Face is symmetric with normal eye closure  CN VIII: Hearing is normal to causal conversation. CN IX, X: Phonation is normal. CN XI: Head turning and shoulder shrug are intact  MOTOR: Mild bilateral hand posturing tremor, no weakness, no rigidity or bradykinesia  REFLEXES: Reflexes are 2+ and symmetric at the biceps, triceps, knees, and ankles. Plantar responses are flexor.  SENSORY: Intact to light touch, pinprick and vibratory sensation are intact in fingers and toes.  COORDINATION: There is no trunk or limb dysmetria noted.  GAIT/STANCE: Posture is normal. Gait is steady with normal steps, base, arm swing, and turning. Heel and toe walking are normal. Tandem gait is normal.  Romberg is absent.  REVIEW OF SYSTEMS:  Full 14 system review of systems performed and notable only for as above All other review of systems were negative.   ALLERGIES: Allergies  Allergen Reactions   Hydrocodone Nausea Only    Headache   Hydrocodone-Acetaminophen  Nausea And Vomiting and Other (See Comments)    Had this reaction at age 18 with wisdom teeth extraction   Sunflower Oil Anaphylaxis    Seeds also    HOME MEDICATIONS: Current Outpatient Medications  Medication Sig Dispense Refill    ciclopirox  (PENLAC ) 8 % solution Apply topically at bedtime. Apply over nail and surrounding skin. Apply daily over previous coat. After seven (7) days, may remove with alcohol and continue cycle. 6.6 mL 1   fluticasone  (FLONASE ) 50 MCG/ACT nasal spray Place 2 sprays into both nostrils daily. 16 g 0   levocetirizine (XYZAL  ALLERGY 24HR) 5 MG tablet Take 1 tablet (5 mg total) by mouth daily as needed for allergies. 90 tablet 3   No current facility-administered medications for this visit.    PAST MEDICAL HISTORY: Past Medical History:  Diagnosis Date   Allergy 05/2001   Asthma    GERD (gastroesophageal reflux disease)     PAST SURGICAL HISTORY: Past Surgical History:  Procedure Laterality Date   CHOLECYSTECTOMY     TUBAL LIGATION N/A 01/29/2022   Procedure: POST PARTUM TUBAL LIGATION;  Surgeon: Teena Feast, MD;  Location: [facility];  Service: Gynecology;  Laterality: N/A;   WISDOM TOOTH EXTRACTION      FAMILY HISTORY: Family History  Problem Relation Age of Onset   Diabetes Mother    Obesity Mother    Diabetes Father    Vision loss Father    Prostate cancer Maternal Grandfather    Diabetes Paternal Grandmother    Hodgkin's lymphoma Paternal Grandfather     SOCIAL HISTORY: Social History   Socioeconomic History   Marital status: Married    Spouse name: Not on file   Number of children: Not on file   Years of education: Not on file   Highest education level: GED or equivalent  Occupational History   Occupation: truck driver  Tobacco Use   Smoking status: Never   Smokeless tobacco: Never  Vaping Use   Vaping status: Never Used  Substance and Sexual Activity   Alcohol use: Never   Drug use: Never   Sexual activity: Yes    Partners: Male    Birth control/protection: Surgical, None  Other Topics Concern   Not on file  Social History Narrative   Not on file   Social Drivers of Health   Financial Resource Strain: Medium Risk (06/18/2023)   Overall  Financial Resource Strain (CARDIA)    Difficulty of Paying Living Expenses: Somewhat hard  Food Insecurity: Patient Declined (06/18/2023)   Hunger Vital Sign    Worried About Running Out of Food in the Last Year: Patient declined    Ran Out of Food in the Last Year: Patient declined  Transportation Needs: No Transportation Needs (06/18/2023)   PRAPARE - Administrator, Civil Service (Medical): No    Lack of Transportation (Non-Medical): No  Physical Activity: Sufficiently Active (06/18/2023)   Exercise Vital Sign    Days of Exercise per Week: 7 days    Minutes of Exercise per Session: 60 min  Stress: Stress Concern Present (06/18/2023)   [employer] of Occupational Health - Occupational Stress Questionnaire    Feeling of Stress : Very much  Social Connections: Unknown (06/18/2023)   Social Connection and Isolation Panel    Frequency of Communication with Friends and Family: Patient declined    Frequency of Social Gatherings with Friends and  Family: Patient declined    Attends Religious Services: Never    Active Member of Clubs or Organizations: No    Attends Engineer, structural: Not on file    Marital Status: Married  Catering manager Violence: Not At Risk (01/29/2022)   Humiliation, Afraid, Rape, and Kick questionnaire    Fear of Current or Ex-Partner: No    Emotionally Abused: No    Physically Abused: No    Sexually Abused: No      Phebe Brasil, M.D. Ph.D.  [facility] [address] [place], [place] [address] Ph: [phone] Fax: [phone]  CC:  Josepha Nickels, DO No address on file  Breeback, Jade L, PA-C

## 2023-06-25 ENCOUNTER — Telehealth: Payer: Self-pay | Admitting: Neurology

## 2023-06-25 NOTE — Telephone Encounter (Signed)
 healthy blue [employer]: [id number] exp. 06/25/23-08/23/23 sent to [facility] [phone]

## 2023-06-26 ENCOUNTER — Telehealth: Payer: Self-pay | Admitting: Physician Assistant

## 2023-06-26 DIAGNOSIS — F4310 Post-traumatic stress disorder, unspecified: Secondary | ICD-10-CM

## 2023-06-26 DIAGNOSIS — F419 Anxiety disorder, unspecified: Secondary | ICD-10-CM

## 2023-06-26 DIAGNOSIS — F439 Reaction to severe stress, unspecified: Secondary | ICD-10-CM

## 2023-06-26 DIAGNOSIS — G4710 Hypersomnia, unspecified: Secondary | ICD-10-CM

## 2023-06-26 DIAGNOSIS — R41840 Attention and concentration deficit: Secondary | ICD-10-CM

## 2023-06-26 NOTE — Telephone Encounter (Signed)
 Copied from CRM [id number]. Topic: General - Other >> Jun 26, 2023  1:10 PM Tiffany H wrote: [facility] called to advise they don't test for autism. They will move forward with offering patient an appointment for counseling.

## 2023-06-26 NOTE — Telephone Encounter (Signed)
 Ok with me

## 2023-06-26 NOTE — Telephone Encounter (Signed)
 Copied from CRM [id number]. Topic: Appointments - Transfer of Care >> Jun 26, 2023  1:29 PM Adrianna P wrote: Pt is requesting to transfer FROM: Sandy Crumb  Pt is requesting to transfer TO: Corita Diego Reason for requested transfer: Personal reasons It is the responsibility of the team the patient would like to transfer to (Dr. Gatha Kaska) to reach out to the patient if for any reason this transfer is not acceptable. Please Advise

## 2023-06-26 NOTE — Telephone Encounter (Signed)
 I am fine with that. Just to be certain however, if her reason for transfer is due to insurance coverage issues (as noted from the previous note) I would assume Amber Donovan and I accept the same insurance.

## 2023-06-26 NOTE — Telephone Encounter (Signed)
 Can we let patient know this. I have asked tosha to weigh in and see if we can find a place locally that offers adult testing.

## 2023-06-26 NOTE — Telephone Encounter (Signed)
 Do you know who test for adult autism in the area?

## 2023-06-26 NOTE — Telephone Encounter (Signed)
 Copied from CRM [id number]. Topic: Referral - Question >> Jun 26, 2023  1:16 PM Karole Pacer C wrote: Reason for CRM: Patient would like to know if the provider would provide her with referral to provider who are covered under the patients insurance. Patient states she had to pay out of pocket $99. Patient states she is no longer interested in weight loss management or psychology due

## 2023-06-26 NOTE — Telephone Encounter (Signed)
 [facility] called and stated that they do not test for autism.

## 2023-06-26 NOTE — Telephone Encounter (Signed)
 Can you give her information sent on 6/12 for referral to [facility]?

## 2023-06-27 ENCOUNTER — Ambulatory Visit
Admission: RE | Admit: 2023-06-27 | Discharge: 2023-06-27 | Disposition: A | Discharge status: Home / Self Care | Source: Ambulatory Visit | Attending: Neurology | Admitting: Neurology

## 2023-06-27 DIAGNOSIS — H539 Unspecified visual disturbance: Secondary | ICD-10-CM

## 2023-06-27 DIAGNOSIS — R251 Tremor, unspecified: Secondary | ICD-10-CM

## 2023-06-28 ENCOUNTER — Encounter: Payer: Self-pay | Admitting: Neurology

## 2023-06-28 NOTE — Telephone Encounter (Signed)
 Please let patient know I placed another referral to [facility] for autism testing.

## 2023-06-28 NOTE — Telephone Encounter (Signed)
 Patient advised.

## 2023-07-01 ENCOUNTER — Encounter: Payer: Self-pay | Admitting: Urgent Care

## 2023-07-01 ENCOUNTER — Ambulatory Visit: Admitting: Urgent Care

## 2023-07-01 NOTE — Telephone Encounter (Signed)
 [facility] does accept [employer] but they do usually have a limited amount of spaces available for Medicaid patients.

## 2023-07-01 NOTE — Telephone Encounter (Signed)
 Referral, clinical notes, demographics and copies of insurance cards have been faxed to [facility] at [phone]. Office will contact patient to schedule referral appointment.      [facility] [employer] may not be accepted per [place], office will review referral and speak with patient.

## 2023-07-02 NOTE — Telephone Encounter (Signed)
 So instead of sending the referral to [facility], you would like me to send it to [facility] ?

## 2023-07-02 NOTE — Telephone Encounter (Signed)
 This patient had requested I be her PCP, but no-showed her appointment today. I'm assuming she decided to stick with Jade reading these messages? I do not know this patient. Thank you

## 2023-07-02 NOTE — Telephone Encounter (Signed)
 Yes if that is what her insurance covers and will do the autism testing patient wishes to have.

## 2023-07-03 NOTE — Telephone Encounter (Unsigned)
 Copied from CRM [id number]. Topic: General - Other >> Jul 03, 2023  1:22 PM Kevelyn M wrote: Reason for CRM: [facility] referred and has [facility]. Patient can only go where medicaid is excepted. [facility] does not accepted. They did say if client is willing to self pay, they will work with the client.

## 2023-07-03 NOTE — Telephone Encounter (Signed)
 Will be sending to [facility]

## 2023-07-05 ENCOUNTER — Ambulatory Visit: Admitting: Urgent Care

## 2023-07-05 NOTE — Telephone Encounter (Signed)
 I forward referral to [facility] , they accept pt insurance

## 2023-07-08 ENCOUNTER — Ambulatory Visit (INDEPENDENT_AMBULATORY_CARE_PROVIDER_SITE_OTHER): Admitting: Urgent Care

## 2023-07-08 VITALS — BP 114/74 | HR 83 | Resp 17 | Ht 64.0 in | Wt 171.2 lb

## 2023-07-08 DIAGNOSIS — F439 Reaction to severe stress, unspecified: Secondary | ICD-10-CM | POA: Diagnosis not present

## 2023-07-08 DIAGNOSIS — G44209 Tension-type headache, unspecified, not intractable: Secondary | ICD-10-CM | POA: Diagnosis not present

## 2023-07-08 DIAGNOSIS — G479 Sleep disorder, unspecified: Secondary | ICD-10-CM

## 2023-07-08 DIAGNOSIS — F909 Attention-deficit hyperactivity disorder, unspecified type: Secondary | ICD-10-CM

## 2023-07-08 DIAGNOSIS — Z62820 Parent-biological child conflict: Secondary | ICD-10-CM

## 2023-07-08 DIAGNOSIS — F4010 Social phobia, unspecified: Secondary | ICD-10-CM

## 2023-07-08 NOTE — Patient Instructions (Signed)
 You have tension headaches, which are caused by stress, dehydration and lack of sleep. Please apply a warm moist compress, such as a microwavable heating pack, to your neck several times daily. After each warm compress, apply a prolonged, deep pressure into the knot of the muscle to release the tension.  We sometimes prescribe anti-inflammatory medications and muscle relaxers to help. If you prefer to avoid oral medications, we could refer you to physical therapy for trigger point injection or dry needling. Try to stay hydrated with WATER  as dehydration and caffeine intake can worsen this condition.   For your stress: You could consider taking over the counter L-tryptophan supplements. This helps your body make melatonin (the sleep hormone) and serotonin (the happy hormone). Take 2 at night and this can improve your overall sleep.   - additional over the counter stress relief medications --> Boiron - Stress calm are natural herbal tablets - essential oils, in particular lavender, can be helpful - practicing deep breathing. This helps more with stress and anxiety. You could look into Calmigo (a breathing device) - caffeine and sugar are linked to depression. Drinking more water , increasing physical activity and eating a more anti-inflammatory diet (such as natural fruits and veggies) can help.
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
## 2023-07-22 ENCOUNTER — Ambulatory Visit: Admitting: Urgent Care

## 2023-07-23 ENCOUNTER — Telehealth: Payer: Self-pay | Admitting: Urgent Care

## 2023-07-23 NOTE — Telephone Encounter (Signed)
-----   Message from Vermell Bologna sent at 07/15/2023  6:32 AM EDT ----- Regarding: RE: Change of PCP I had previously said yes and then she missed appt with Whitney. Not sure of how Whitney feels about this but I am all for what the patient wants especially since she is a prior [facility] patient. ----- Message ----- From: [place], Tosha L Sent: 07/08/2023   1:39 PM EDT To: Vermell LITTIE Bologna, PA-C; Benton LITTIE Gave, PA Subject: Change of PCP                                  Pt is requesting to change PCP. Pt states she feels more of a connection with Whitney. Please advise.   [employer] Lead

## 2023-07-23 NOTE — Telephone Encounter (Signed)
 Ok per [employer]. As well. See message below:  Lowella Benton CROME, PA  [place], Tosha L I believe pt had already previously asked prior to coming today. Yes, that is perfectly fine.       Previous Messages    ----- Message ----- From: [place], Tosha L Sent: 07/08/2023   1:39 PM EDT To: Vermell CROME Bologna, PA-C; Benton CROME Lowella, PA Subject: Change of PCP                                  Pt is requesting to change PCP. Pt states she feels more of a connection with Whitney. Please advise.   [employer] Lead

## 2023-07-24 ENCOUNTER — Institutional Professional Consult (permissible substitution): Admitting: Neurology

## 2023-07-26 ENCOUNTER — Encounter: Payer: Self-pay | Admitting: Urgent Care

## 2023-07-26 NOTE — Telephone Encounter (Signed)
 Patient canceled her appointment

## 2023-07-26 NOTE — Telephone Encounter (Signed)
 Will do!

## 2023-08-05 ENCOUNTER — Ambulatory Visit (INDEPENDENT_AMBULATORY_CARE_PROVIDER_SITE_OTHER): Admitting: Urgent Care

## 2023-08-05 ENCOUNTER — Encounter: Payer: Self-pay | Admitting: Urgent Care

## 2023-08-05 VITALS — BP 121/75 | HR 81 | Resp 18 | Ht 64.0 in | Wt 173.5 lb

## 2023-08-05 DIAGNOSIS — F909 Attention-deficit hyperactivity disorder, unspecified type: Secondary | ICD-10-CM | POA: Diagnosis not present

## 2023-08-05 DIAGNOSIS — F4310 Post-traumatic stress disorder, unspecified: Secondary | ICD-10-CM | POA: Diagnosis not present

## 2023-08-05 DIAGNOSIS — F411 Generalized anxiety disorder: Secondary | ICD-10-CM | POA: Diagnosis not present

## 2023-08-05 MED ORDER — BUPROPION HCL ER (XL) 150 MG PO TB24: 150.0000 mg | ORAL_TABLET | ORAL | 1 refills | Status: DC

## 2023-08-05 NOTE — Patient Instructions (Signed)
 Start buproprion daily.  Start L-tryptophan two tabs before bed.  I have placed a referral for therapy. Please return in 6 weeks for follow up

## 2023-08-05 NOTE — Progress Notes (Unsigned)
   Established Patient Office Visit  Subjective:  Patient ID: Amber Donovan, female    DOB: [birth year]  Age: 28 y.o. MRN: [id number]  No chief complaint on file.   HPI  {History (Optional):23778}  ROS: as noted in HPI  Objective:     BP 121/75 (BP Location: Left Arm, Patient Position: Sitting, Cuff Size: Normal)   Pulse 81   Resp 18   Ht 5' 4 (1.626 m)   Wt 173 lb 8 oz (78.7 kg)   SpO2 100%   BMI 29.78 kg/m  {Vitals History (Optional):23777}  Physical Exam   No results found for any visits on 08/05/23.  {Labs (Optional):23779}  The ASCVD Risk score (Arnett DK, et al., 2019) failed to calculate for the following reasons:   The 2019 ASCVD risk score is only valid for ages 39 to 78  Assessment & Plan:  There are no diagnoses linked to this encounter.   No follow-ups on file.   Benton LITTIE Gave, PA

## 2023-08-06 ENCOUNTER — Encounter: Payer: Self-pay | Admitting: Urgent Care

## 2023-08-06 ENCOUNTER — Institutional Professional Consult (permissible substitution): Admitting: Neurology

## 2023-08-21 ENCOUNTER — Institutional Professional Consult (permissible substitution): Admitting: Neurology

## 2023-08-21 ENCOUNTER — Encounter: Payer: Self-pay | Admitting: Urgent Care

## 2023-09-11 ENCOUNTER — Ambulatory Visit: Payer: Self-pay | Admitting: Professional

## 2023-09-16 ENCOUNTER — Encounter: Payer: Self-pay | Admitting: Urgent Care

## 2023-09-16 ENCOUNTER — Ambulatory Visit: Admitting: Urgent Care

## 2023-09-16 ENCOUNTER — Ambulatory Visit (INDEPENDENT_AMBULATORY_CARE_PROVIDER_SITE_OTHER): Admitting: Urgent Care

## 2023-09-16 VITALS — BP 92/51 | HR 69 | Ht 64.0 in | Wt 174.0 lb

## 2023-09-16 DIAGNOSIS — F4010 Social phobia, unspecified: Secondary | ICD-10-CM | POA: Diagnosis not present

## 2023-09-16 DIAGNOSIS — F411 Generalized anxiety disorder: Secondary | ICD-10-CM | POA: Diagnosis not present

## 2023-09-16 DIAGNOSIS — F4310 Post-traumatic stress disorder, unspecified: Secondary | ICD-10-CM | POA: Diagnosis not present

## 2023-09-16 MED ORDER — DESVENLAFAXINE SUCCINATE ER 25 MG PO TB24: 25.0000 mg | ORAL_TABLET | Freq: Every day | ORAL | 1 refills | Status: DC

## 2023-09-16 NOTE — Progress Notes (Signed)
 Established Patient Office Visit  Subjective:  Patient ID: Amber Donovan, female    DOB: 04/17/1995  Age: 28 y.o. MRN: [id number]  Chief Complaint  Patient presents with   Follow-up    anxiety    HPI  Discussed the use of AI scribe software for clinical note transcription with the patient, who gave verbal consent to proceed.  History of Present Illness   Amber Donovan is a 28 year old female who presents with adverse reactions to medication and concerns about mental health management.  She experiences severe side effects after taking Wellbutrin , including feeling heavy, fatigued, nauseated, foggy-brained, and shaky the morning after. She has not taken it since due to these adverse effects. She has a history of sensitivity to medications, reacting strongly even to herbal supplements. A previous experience with hydroxyzine made her feel ditzy and weird, causing concern from her family.  She reports feeling anxious, depressed, overstimulated, and having difficulty with concentration. She has tried Wellbutrin  and another unspecified medication for these symptoms, both resulting in undesirable side effects. She is frustrated by difficulty scheduling therapy appointments. She had to cancel a recent appointment due to a scheduling conflict and now faces a long wait for rescheduling.  She struggles with sugar cravings, attributing them to stress and acknowledging them as a slow-going addiction. She notes quick weight gain even with small amounts of sugary foods. She reports a low sex drive, which she finds frustrating and feels guilty about, as it affects her relationship with her husband. She reports feeling exhausted and inadequate.  She feels overwhelmed by her responsibilities, including caring for her four children under the age of six and living with her mother-in-law, who is ill and critical of her. She feels judged and unsupported, contributing to her stress and anger. Financial  difficulties limit her ability to find a suitable job and contribute to her stress. 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 She does suffer from PTSD and significant childhood trauma, which appears to be unresolved further contributing to her mental health symptoms. No active SI.       Patient Active Problem List   Diagnosis Date Noted   ADHD 06/24/2023   Tremor 06/24/2023   Change in vision 06/24/2023   Excessive sleepiness 06/24/2023   Attention or concentration deficit 06/19/2023   Hair loss 04/10/2023   Ingrown toenail of both feet 04/08/2023   Trouble in sleeping 04/08/2023   Seasonal allergies 04/08/2023   Overweight (BMI 25.0-29.9) 04/08/2023   Chronic pain of both knees 12/13/2022   Onychomycosis 12/13/2022   Anxiety 07/11/2022   PTSD (post-traumatic stress disorder) 07/11/2022   Stress 07/05/2022   Other fatigue 04/23/2022   Concentration deficit 04/23/2022   Polydipsia 04/23/2022   Synesthesia 04/23/2022   Past Medical History:  Diagnosis Date   Allergy 05/2001   Asthma    GERD (gastroesophageal reflux disease)    Past Surgical History:  Procedure Laterality Date   CHOLECYSTECTOMY     TUBAL LIGATION N/A 01/29/2022   Procedure: POST PARTUM TUBAL LIGATION;  Surgeon: Lola Donnice HERO, MD;  Location: [facility];  Service: Gynecology;  Laterality: N/A;   WISDOM TOOTH EXTRACTION     Social History   Tobacco Use   Smoking status: Never   Smokeless tobacco: Never  Vaping Use   Vaping status: Never Used  Substance Use Topics   Alcohol use: Never   Drug use: Never      ROS: as  noted in HPI  Objective:     BP (!) 92/51   Pulse 69   Ht 5' 4 (1.626 m)   Wt 174 lb (78.9 kg)   SpO2 98%   BMI 29.87 kg/m  BP Readings from Last 3 Encounters:  09/16/23 (!) 92/51  08/05/23 121/75  07/08/23 114/74   Wt Readings from Last 3 Encounters:  09/16/23 174 lb (78.9 kg)  08/05/23 173 lb 8 oz (78.7 kg)  07/08/23 171  lb 4 oz (77.7 kg)      Physical Exam Vitals and nursing note reviewed.  Constitutional:      General: She is not in acute distress.    Appearance: Normal appearance. She is not ill-appearing, toxic-appearing or diaphoretic.  HENT:     Head: Normocephalic and atraumatic.  Eyes:     General: No scleral icterus.       Right eye: No discharge.        Left eye: No discharge.     Extraocular Movements: Extraocular movements intact.     Pupils: Pupils are equal, round, and reactive to light.  Cardiovascular:     Rate and Rhythm: Normal rate.  Pulmonary:     Effort: Pulmonary effort is normal. No respiratory distress.  Skin:    General: Skin is warm and dry.     Coloration: Skin is not jaundiced.     Findings: No bruising, erythema or rash.  Neurological:     General: No focal deficit present.     Mental Status: She is alert and oriented to person, place, and time.     Gait: Gait normal.  Psychiatric:        Mood and Affect: Mood normal.        Behavior: Behavior normal.      No results found for any visits on 09/16/23.  Last CBC Lab Results  Component Value Date   WBC 10.7 (H) 01/29/2022   HGB 11.8 (L) 01/29/2022   HCT 35.0 (L) 01/29/2022   MCV 83.9 01/29/2022   MCH 28.3 01/29/2022   RDW 13.2 01/29/2022   PLT 237 01/29/2022   Last metabolic panel Lab Results  Component Value Date   GLUCOSE 76 04/23/2022   NA 141 04/23/2022   K 4.5 04/23/2022   CL 104 04/23/2022   CO2 26 04/23/2022   BUN 20 04/23/2022   CREATININE 0.77 04/23/2022   EGFR 108 04/23/2022   CALCIUM 9.8 04/23/2022   PROT 7.6 04/23/2022   ALBUMIN 3.5 (L) 12/25/2021   LABGLOB 2.6 12/25/2021   AGRATIO 1.3 12/25/2021   BILITOT 0.5 04/23/2022   ALKPHOS 143 (H) 12/25/2021   AST 14 04/23/2022   ALT 14 04/23/2022   Last lipids No results found for: CHOL, HDL, LDLCALC, LDLDIRECT, TRIG, CHOLHDL       09/16/2023   10:03 AM 08/05/2023    3:46 PM 08/05/2023    3:26 PM 04/08/2023    3:25 PM  07/11/2022    8:03 AM  Depression screen PHQ 2/9  Decreased Interest 2 2 3  0 1  Down, Depressed, Hopeless 1 2 2  0 2  PHQ - 2 Score 3 4 5  0 3  Altered sleeping 2 3  3  0  Tired, decreased energy 2 3  3 2   Change in appetite 3 3  2 1   Feeling bad or failure about yourself  3 2  0 3  Trouble concentrating 3 1  2 2   Moving slowly or fidgety/restless 3 3  0 3  Suicidal thoughts 1 0  0 1  PHQ-9 Score 20 19  10 15   Difficult doing work/chores Very difficult Somewhat difficult   Somewhat difficult      09/16/2023   10:03 AM 08/05/2023    3:47 PM 04/08/2023    3:26 PM 01/09/2022    9:21 AM  GAD 7 : Generalized Anxiety Score  Nervous, Anxious, on Edge 2 3 2 2   Control/stop worrying 0 2 2 0  Worry too much - different things 1 3 2  0  Trouble relaxing 3 3 0 0  Restless 3 3 2  0  Easily annoyed or irritable 3 3 3 1   Afraid - awful might happen 0 0 0 0  Total GAD 7 Score 12 17 11 3   Anxiety Difficulty Very difficult Somewhat difficult Not difficult at all       The ASCVD Risk score (Arnett DK, et al., 2019) failed to calculate for the following reasons:   The 2019 ASCVD risk score is only valid for ages 18 to 40  Assessment & Plan:  Generalized anxiety disorder -     Desvenlafaxine  Succinate ER; Take 1 tablet (25 mg total) by mouth daily.  Dispense: 90 tablet; Refill: 1  PTSD (post-traumatic stress disorder) -     Desvenlafaxine  Succinate ER; Take 1 tablet (25 mg total) by mouth daily.  Dispense: 90 tablet; Refill: 1  Social anxiety disorder -     Desvenlafaxine  Succinate ER; Take 1 tablet (25 mg total) by mouth daily.  Dispense: 90 tablet; Refill: 1  Assessment and Plan    Depression, anxiety, and post-traumatic stress disorder (PTSD) with associated difficulty with concentration and low libido Ongoing symptoms of depression, anxiety, and PTSD with concentration issues and low libido. Anger, frustration, and inadequacy linked to family dynamics and past experiences. Serotonin deficit  noted. Medication sensitivity complicates treatment. Prefers in-house therapy to avoid travel. - Refer to in-house therapist Nathanel Collet for therapy sessions. Pt missed 09/11/23 appt - Completed [facility] genetic testing to determine medication metabolism and appropriate dosing. - would recommend starting pristique 25mg  daily prior to test results given severity of sx.  Adverse effect of bupropion  and medication hypersensitivity Significant adverse effects from bupropion , including fatigue, nausea, and cognitive fog, due to medication hypersensitivity. Delayed side effects possibly due to metabolic differences. - genetic testing to assess medication metabolism and guide future pharmacotherapy.  Sugar craving and possible binge eating Strong sugar cravings and possible binge eating linked to stress and emotional triggers. Pattern of using sugary foods for comfort, potentially related to serotonin deficiency. - Discuss the role of serotonin in craving and mood regulation. - Encourage continued efforts to reduce sugar intake and explore alternative coping mechanisms.      Total time spent including face to face time, chart review, lab interpretation, documentation and counseling was 30 minutes    Return in about 8 weeks (around 11/11/2023).   Benton LITTIE Gave, PA

## 2023-09-16 NOTE — Patient Instructions (Addendum)
 We completed a genetic test today to test for the best medication for your symptoms.  I would STRONGLY encouraged you to try one additional medication prior to results of the test (it can take 2-4 weeks). Please start pristique daily.   Please call Nathanel Collet and schedule a follow up counseling session. Follow up with me in 8 weeks.

## 2023-09-18 ENCOUNTER — Telehealth: Payer: Self-pay | Admitting: Urgent Care

## 2023-09-18 NOTE — Telephone Encounter (Signed)
 [facility] called stating that they have the sample for this patient but no active order. Can we order one for her?

## 2023-09-18 NOTE — Telephone Encounter (Signed)
 I have it taken care of. They never added me as an ordering provider since moving over here, but I had to speak with their rep last night. I will place the order this afternoon

## 2023-09-20 ENCOUNTER — Telehealth: Payer: Self-pay

## 2023-09-20 NOTE — Telephone Encounter (Signed)
 Copied from CRM [id number]. Topic: General - Other >> Sep 19, 2023  3:35 PM Susanna ORN wrote: Reason for CRM: Duwaine, with [facility], called wanting to speak with Dr. Lowella in regards to patient's [facility] report. Wants to know if she has any questions or concerns as patient received the report. Please give her a call back. CB #: [phone], which is Megan's direct line.

## 2023-09-20 NOTE — Telephone Encounter (Signed)
 I'm wondering if they called because I was just listed as a new provider for them? I have been ordering this test for years... I should not need to speak with anyone about it and will review the results with the patient upon my return. thanks

## 2023-09-20 NOTE — Telephone Encounter (Signed)
 Let me know if you have any questions or concerns and I will give the [facility] person listed, megan a call and let her know.

## 2023-09-20 NOTE — Telephone Encounter (Signed)
 Called Megan and left a detailed voice mail message on confidential voice mail as written by provider

## 2023-09-23 ENCOUNTER — Encounter: Payer: Self-pay | Admitting: Urgent Care

## 2023-09-25 ENCOUNTER — Encounter: Payer: Self-pay | Admitting: Professional

## 2023-09-25 ENCOUNTER — Ambulatory Visit (INDEPENDENT_AMBULATORY_CARE_PROVIDER_SITE_OTHER): Admitting: Professional

## 2023-09-25 DIAGNOSIS — F902 Attention-deficit hyperactivity disorder, combined type: Secondary | ICD-10-CM

## 2023-09-25 DIAGNOSIS — F4310 Post-traumatic stress disorder, unspecified: Secondary | ICD-10-CM

## 2023-09-25 DIAGNOSIS — F419 Anxiety disorder, unspecified: Secondary | ICD-10-CM

## 2023-09-25 DIAGNOSIS — F439 Reaction to severe stress, unspecified: Secondary | ICD-10-CM
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
## 2023-09-25 NOTE — Progress Notes (Signed)
 Amber Donovan

## 2023-09-25 NOTE — Telephone Encounter (Signed)
 Spoke with patient - states this message was meant for [employer] and had hoped that Benton Gave would be able to get the message to her. Since Whitney was out of the office for the next two weeks this message was reviewed by Dr. Alvan . Patient was informed of Dr. Vernida recommendations and states Benton is aware of these issues as they were discussed with her previously.  She has an appointment with nathanel collet today at 3pm

## 2023-09-25 NOTE — Telephone Encounter (Signed)
 I would recommend that we get her in with a therapist or counselor to talk about some of these issues and concerns and then if they feel like we might need to actually involve a psychiatrist we can.  Also notes she has a lot of stress and anxiety listed on her chart as well so I do think this could be helpful with some of the emotions and feelings she is experiencing if she is okay with referral then let me know.  If she would prefer to wait until Benton returns to address this that is fine to.

## 2023-10-03 ENCOUNTER — Ambulatory Visit: Admitting: Professional

## 2023-10-03 DIAGNOSIS — F4310 Post-traumatic stress disorder, unspecified: Secondary | ICD-10-CM | POA: Diagnosis not present

## 2023-10-03 DIAGNOSIS — F419 Anxiety disorder, unspecified: Secondary | ICD-10-CM | POA: Diagnosis not present

## 2023-10-03 DIAGNOSIS — F902 Attention-deficit hyperactivity disorder, combined type: Secondary | ICD-10-CM | POA: Diagnosis not present

## 2023-10-03 DIAGNOSIS — F439 Reaction to severe stress, unspecified: Secondary | ICD-10-CM | POA: Diagnosis not present

## 2023-10-03 DIAGNOSIS — F322 Major depressive disorder, single episode, severe without psychotic features: Secondary | ICD-10-CM
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
## 2023-10-06 ENCOUNTER — Encounter: Payer: Self-pay | Admitting: Professional

## 2023-10-07 ENCOUNTER — Ambulatory Visit (INDEPENDENT_AMBULATORY_CARE_PROVIDER_SITE_OTHER): Admitting: Professional

## 2023-10-07 ENCOUNTER — Encounter: Payer: Self-pay | Admitting: Professional

## 2023-10-07 DIAGNOSIS — F4310 Post-traumatic stress disorder, unspecified: Secondary | ICD-10-CM

## 2023-10-07 DIAGNOSIS — F419 Anxiety disorder, unspecified: Secondary | ICD-10-CM | POA: Diagnosis not present

## 2023-10-07 DIAGNOSIS — F902 Attention-deficit hyperactivity disorder, combined type: Secondary | ICD-10-CM | POA: Diagnosis not present

## 2023-10-07 DIAGNOSIS — F322 Major depressive disorder, single episode, severe without psychotic features: Secondary | ICD-10-CM

## 2023-10-07 NOTE — Progress Notes (Signed)
   Amber Donovan, [facility]

## 2023-10-07 NOTE — Progress Notes (Deleted)
 [facility] Counselor Initial Adult Exam  Name: Amber Donovan Date: 10/07/2023 MRN: [id number] DOB: Jan 14, 1995 PCP: Lowella Benton CROME, PA  Time spent: 45 minutes 901-945am  Guardian/Payee:  patient    Paperwork requested: {EDB:78802}  Reason for Visit /Presenting Problem: ***  Mental Status Exam: Appearance:   {PSY:22683}     Behavior:  {PSY:21022743}  Motor:  {PSY:22302}  Speech/Language:   {PSY:22685}  Affect:  {PSY:22687}  Mood:  {PSY:31886}  Thought process:  {PSY:31888}  Thought content:    {PSY:[phone]}  Sensory/Perceptual disturbances:    {PSY:657-069-3195}  Orientation:  {PSY:30297}  Attention:  {PSY:22877}  Concentration:  {[phone]}  Memory:  {[phone]}  Fund of knowledge:   {[phone]}  Insight:    {[phone]}  Judgment:   {[phone]}  Impulse Control:  {[phone]}   Appearance: {PSY:22683}    Behavior: {PSY:21022743} Motor: {PSY:22302} Speech/Language: {PSY:22685} Affect: {PSY:22687} Mood: {PSY:31886} Thought process: {PSY:31888} Thought content: {PSY:561-689-6906} Sensory/Perceptual disturbances: {PSY:657-069-3195} Orientation: {PSY:30297} Attention: {PSY:22877} Concentration: {[phone]} Memory: {[phone] [phone]} Fund of knowledge: {[phone]} Insight: {[phone]} Judgment: {[phone]} Impulse Control: {[phone]}  Reported Symptoms:  ***  Risk Assessment: Danger to Self:  {PSY:22692} Self-injurious Behavior: {PSY:22692} Danger to Others: {PSY:22692} Duty to Warn:{PSY:311194}  Substance Abuse History: Current substance abuse: {PSY:21197}    Past Psychiatric History:   {Past psych history:20559} Outpatient Providers:*** History of Psych Hospitalization: {PSY:21197} Psychological Testing: {PSY:21014032}   Abuse History:  Victim of: {Abuse History:314532}, {Type of abuse:20566}   Report needed: {PSY:314532} Victim of Neglect:{yes no:314532} Perpetrator of  {PSY:20566}  Witness / Exposure to Domestic Violence: {PSY:21197}  Protective Services Involvement: {PSY:21197} Witness to [employer] Violence:  {PSY:21197}  Family History:  Family History  Problem Relation Age of Onset   Diabetes Mother    Obesity Mother    Diabetes Father    Vision loss Father    Prostate cancer Maternal Grandfather    Diabetes Paternal Grandmother    Hodgkin's lymphoma Paternal Grandfather     Living situation: the patient {lives:315711::lives with their family}  Sexual Orientation: {Sexual Orientation:[phone]}  Relationship Status: {Desc; marital status:62}  Name of spouse / other:*** If a parent, number of children / ages:***  Support Systems: {DIABETES SUPPORT:20310}  Financial Stress:  {YES/NO:21197}  Income/Employment/Disability: Manufacturing engineer: [employer]  Educational History: Education: {PSY :31912}  Religion/Sprituality/World View: {CHL AMB RELIGION/[phone]}  Any cultural differences that may affect / interfere with treatment:  {Religious/Cultural:200019}  Recreation/Hobbies: {Woc hobbies:30428}  Stressors: {PATIENT STRESSORS:22669}  Strengths: {Patient Coping Strengths:[phone]}  Barriers:  ***   Legal History: Pending legal issue / charges: {PSY:20588} History of legal issue / charges: {Legal Issues:[phone]}  Medical History/Surgical History: {Desc; reviewed/not reviewed:60074} Past Medical History:  Diagnosis Date   Allergy 05/2001   Asthma    GERD (gastroesophageal reflux disease)     Past Surgical History:  Procedure Laterality Date   CHOLECYSTECTOMY     TUBAL LIGATION N/A 01/29/2022   Procedure: POST PARTUM TUBAL LIGATION;  Surgeon: Lola Donnice HERO, MD;  Location: [facility] LD [facility]S;  Service: Gynecology;  Laterality: N/A;   WISDOM TOOTH EXTRACTION      Medications: Current Outpatient Medications  Medication Sig Dispense Refill   buPROPion  (WELLBUTRIN  XL) 150  MG 24 hr tablet Take 1 tablet (150 mg total) by mouth every morning. (Patient not taking: Reported on 09/16/2023) 90 tablet 1   ciclopirox  (PENLAC ) 8 % solution Apply topically at bedtime. Apply over nail and surrounding skin. Apply daily over previous coat. After seven (7) days, may remove with alcohol and continue cycle. 6.6  mL 1   desvenlafaxine  (PRISTIQ ) 25 MG 24 hr tablet Take 1 tablet (25 mg total) by mouth daily. 90 tablet 1   fluticasone  (FLONASE ) 50 MCG/ACT nasal spray Place 2 sprays into both nostrils daily. 16 g 0   levocetirizine (XYZAL  ALLERGY 24HR) 5 MG tablet Take 1 tablet (5 mg total) by mouth daily as needed for allergies. 90 tablet 3   No current facility-administered medications for this visit.    Allergies  Allergen Reactions   Hydrocodone Nausea Only    Headache   Hydrocodone-Acetaminophen  Nausea And Vomiting and Other (See Comments)    Had this reaction at age 17 with wisdom teeth extraction   Sunflower Oil Anaphylaxis    Seeds also    Diagnoses:  No diagnosis found.  Plan of Care: ***   Nathanel Collet, [facility]
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
## 2023-10-22 ENCOUNTER — Ambulatory Visit (INDEPENDENT_AMBULATORY_CARE_PROVIDER_SITE_OTHER): Admitting: Professional

## 2023-10-22 ENCOUNTER — Encounter: Payer: Self-pay | Admitting: Professional

## 2023-10-22 DIAGNOSIS — F4310 Post-traumatic stress disorder, unspecified: Secondary | ICD-10-CM

## 2023-10-22 DIAGNOSIS — F419 Anxiety disorder, unspecified: Secondary | ICD-10-CM

## 2023-10-22 DIAGNOSIS — F322 Major depressive disorder, single episode, severe without psychotic features: Secondary | ICD-10-CM | POA: Diagnosis not present

## 2023-10-22 DIAGNOSIS — F902 Attention-deficit hyperactivity disorder, combined type: Secondary | ICD-10-CM | POA: Diagnosis not present
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
## 2023-10-25 ENCOUNTER — Telehealth: Payer: Self-pay

## 2023-10-25 NOTE — Telephone Encounter (Signed)
 Copied from CRM [id number]. Topic: General - Other >> Oct 25, 2023 11:31 AM Delon DASEN wrote: Reason for CRM: Need to speak with Nathanel Collet- [phone]

## 2023-11-05 ENCOUNTER — Ambulatory Visit (INDEPENDENT_AMBULATORY_CARE_PROVIDER_SITE_OTHER): Admitting: Professional

## 2023-11-05 ENCOUNTER — Encounter: Payer: Self-pay | Admitting: Professional

## 2023-11-05 DIAGNOSIS — F411 Generalized anxiety disorder: Secondary | ICD-10-CM | POA: Diagnosis not present

## 2023-11-05 DIAGNOSIS — F4310 Post-traumatic stress disorder, unspecified: Secondary | ICD-10-CM

## 2023-11-05 DIAGNOSIS — F902 Attention-deficit hyperactivity disorder, combined type: Secondary | ICD-10-CM | POA: Diagnosis not present

## 2023-11-05 DIAGNOSIS — F322 Major depressive disorder, single episode, severe without psychotic features: Secondary | ICD-10-CM
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
## 2023-11-11 ENCOUNTER — Ambulatory Visit: Payer: Self-pay | Admitting: Urgent Care

## 2023-11-11 ENCOUNTER — Ambulatory Visit: Admitting: Urgent Care

## 2023-11-11 VITALS — BP 107/63 | HR 88 | Ht 64.0 in | Wt 172.0 lb

## 2023-11-11 DIAGNOSIS — M25561 Pain in right knee: Secondary | ICD-10-CM

## 2023-11-11 DIAGNOSIS — F4310 Post-traumatic stress disorder, unspecified: Secondary | ICD-10-CM | POA: Diagnosis not present

## 2023-11-11 DIAGNOSIS — J302 Other seasonal allergic rhinitis: Secondary | ICD-10-CM

## 2023-11-11 DIAGNOSIS — F411 Generalized anxiety disorder: Secondary | ICD-10-CM

## 2023-11-11 DIAGNOSIS — M2550 Pain in unspecified joint: Secondary | ICD-10-CM | POA: Diagnosis not present

## 2023-11-11 DIAGNOSIS — R591 Generalized enlarged lymph nodes: Secondary | ICD-10-CM

## 2023-11-11 DIAGNOSIS — J069 Acute upper respiratory infection, unspecified: Secondary | ICD-10-CM

## 2023-11-11 DIAGNOSIS — M542 Cervicalgia: Secondary | ICD-10-CM

## 2023-11-11 DIAGNOSIS — M25562 Pain in left knee: Secondary | ICD-10-CM | POA: Diagnosis not present

## 2023-11-11 DIAGNOSIS — G8929 Other chronic pain: Secondary | ICD-10-CM | POA: Diagnosis not present

## 2023-11-11 LAB — POC COVID19/FLU A&B COMBO
Covid Antigen, POC: NEGATIVE
Influenza A Antigen, POC: NEGATIVE
Influenza B Antigen, POC: NEGATIVE

## 2023-11-11 LAB — POCT RAPID STREP A (OFFICE): Rapid Strep A Screen: NEGATIVE

## 2023-11-11 MED ORDER — TIZANIDINE HCL 4 MG PO TABS: 4.0000 mg | ORAL_TABLET | Freq: Three times a day (TID) | ORAL | 0 refills | Status: DC | PRN

## 2023-11-11 MED ORDER — ALBUTEROL SULFATE HFA 108 (90 BASE) MCG/ACT IN AERS: 2.0000 | INHALATION_SPRAY | Freq: Four times a day (QID) | RESPIRATORY_TRACT | 0 refills | Status: AC | PRN

## 2023-11-11 MED ORDER — FLUTICASONE PROPIONATE 50 MCG/ACT NA SUSP: 2.0000 | Freq: Every day | NASAL | 0 refills | Status: AC

## 2023-11-11 MED ORDER — LEVOCETIRIZINE DIHYDROCHLORIDE 5 MG PO TABS: 5.0000 mg | ORAL_TABLET | Freq: Every day | ORAL | 3 refills | Status: AC | PRN

## 2023-11-11 MED ORDER — NAPROXEN 500 MG PO TABS: 500.0000 mg | ORAL_TABLET | Freq: Two times a day (BID) | ORAL | 3 refills | Status: AC

## 2023-11-11 NOTE — Patient Instructions (Signed)
 You have cervical trigger point, which is knots in the muscles of the neck. Please apply a warm moist compress, such as a microwavable heating pack, to your neck several times daily. After each warm compress, apply the technique that we discussed today of ischemic release. This is a prolonged, deep pressure into the knot of the muscle to release the tension.  Take the muscle relaxer up to three times daily as needed. Keep in mind it may make you feel tired or drowsy, so do not operate machinery or drive a car until you know how it affects you. (Best taken at night)  Please take the anti-inflammatory medication called in today twice daily with food. Do not take any additional OTC NSAIDS (advil , motrin , ibuprofen , aleve, naproxen).    If your symptoms persist, you would be a candidate for trigger point injection or dry needling. I did place a referral for physical therapy.  Try to stay hydrated with WATER  as dehydration and caffeine intake can worsen this condition.    For your stress - consider trying OTC L-tryptophan.  We drew labs to assess your joint pain.    For your cold - please continue to use flonase , albuterol as needed and xyzal . You would also benefit from a saline nasal lavage
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
## 2023-11-12 ENCOUNTER — Encounter: Payer: Self-pay | Admitting: Professional

## 2023-11-12 ENCOUNTER — Ambulatory Visit: Admitting: Professional

## 2023-11-12 ENCOUNTER — Encounter: Payer: Self-pay | Admitting: Urgent Care

## 2023-11-12 DIAGNOSIS — F902 Attention-deficit hyperactivity disorder, combined type: Secondary | ICD-10-CM | POA: Diagnosis not present

## 2023-11-12 DIAGNOSIS — F411 Generalized anxiety disorder: Secondary | ICD-10-CM

## 2023-11-12 DIAGNOSIS — F4310 Post-traumatic stress disorder, unspecified: Secondary | ICD-10-CM | POA: Diagnosis not present

## 2023-11-12 DIAGNOSIS — F322 Major depressive disorder, single episode, severe without psychotic features: Secondary | ICD-10-CM
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
## 2023-11-13 LAB — TSH: TSH: 2.11 u[IU]/mL (ref 0.450–4.500)

## 2023-11-13 LAB — CMP14+EGFR
ALT: 11 IU/L (ref 0–32)
AST: 15 IU/L (ref 0–40)
Albumin: 4.6 g/dL (ref 4.0–5.0)
Alkaline Phosphatase: 54 IU/L (ref 41–116)
BUN/Creatinine Ratio: 7 — ABNORMAL LOW (ref 9–23)
BUN: 6 mg/dL (ref 6–20)
Bilirubin Total: 0.7 mg/dL (ref 0.0–1.2)
CO2: 20 mmol/L (ref 20–29)
Calcium: 9.1 mg/dL (ref 8.7–10.2)
Chloride: 104 mmol/L (ref 96–106)
Creatinine, Ser: 0.83 mg/dL (ref 0.57–1.00)
Globulin, Total: 2.8 g/dL (ref 1.5–4.5)
Glucose: 75 mg/dL (ref 70–99)
Potassium: 3.9 mmol/L (ref 3.5–5.2)
Sodium: 140 mmol/L (ref 134–144)
Total Protein: 7.4 g/dL (ref 6.0–8.5)
eGFR: 98 mL/min/1.73 (ref >59)

## 2023-11-13 LAB — CBC WITH DIFFERENTIAL/PLATELET
Basophils Absolute: 0.1 x10E3/uL (ref 0.0–0.2)
Basos: 0 %
EOS (ABSOLUTE): 0.1 x10E3/uL (ref 0.0–0.4)
Eos: 1 %
Hematocrit: 40.5 % (ref 34.0–46.6)
Hemoglobin: 13.1 g/dL (ref 11.1–15.9)
Immature Grans (Abs): 0 x10E3/uL (ref 0.0–0.1)
Immature Granulocytes: 0 %
Lymphocytes Absolute: 1.7 x10E3/uL (ref 0.7–3.1)
Lymphs: 13 %
MCH: 30 pg (ref 26.6–33.0)
MCHC: 32.3 g/dL (ref 31.5–35.7)
MCV: 93 fL (ref 79–97)
Monocytes Absolute: 0.9 x10E3/uL (ref 0.1–0.9)
Monocytes: 7 %
Neutrophils Absolute: 9.7 x10E3/uL — ABNORMAL HIGH (ref 1.4–7.0)
Neutrophils: 79 %
Platelets: 182 x10E3/uL (ref 150–450)
RBC: 4.36 x10E6/uL (ref 3.77–5.28)
RDW: 12.4 % (ref 11.7–15.4)
WBC: 12.5 x10E3/uL — ABNORMAL HIGH (ref 3.4–10.8)

## 2023-11-13 LAB — ANA,IFA RA DIAG PNL W/RFLX TIT/PATN
ANA Titer 1: NEGATIVE
Cyclic Citrullin Peptide Ab: 13 U (ref 0–19)
Rheumatoid fact SerPl-aCnc: <10 [IU]/mL (ref <14.0)

## 2023-11-13 LAB — SEDIMENTATION RATE: Sed Rate: 19 mm/h (ref 0–32)
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
## 2023-11-18 ENCOUNTER — Ambulatory Visit: Payer: Self-pay | Admitting: Rehabilitative and Restorative Service Providers"

## 2023-11-19 ENCOUNTER — Ambulatory Visit: Admitting: Professional

## 2023-11-19 ENCOUNTER — Encounter: Payer: Self-pay | Admitting: Professional

## 2023-11-19 DIAGNOSIS — F902 Attention-deficit hyperactivity disorder, combined type: Secondary | ICD-10-CM

## 2023-11-19 DIAGNOSIS — F322 Major depressive disorder, single episode, severe without psychotic features: Secondary | ICD-10-CM

## 2023-11-19 DIAGNOSIS — F4310 Post-traumatic stress disorder, unspecified: Secondary | ICD-10-CM | POA: Diagnosis not present

## 2023-11-19 DIAGNOSIS — F411 Generalized anxiety disorder: Secondary | ICD-10-CM
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
## 2023-11-26 ENCOUNTER — Ambulatory Visit: Attending: Urgent Care | Admitting: Rehabilitative and Restorative Service Providers"

## 2023-11-27 ENCOUNTER — Ambulatory Visit: Admitting: Professional

## 2023-11-28 ENCOUNTER — Ambulatory Visit: Admitting: Professional

## 2023-12-03 ENCOUNTER — Ambulatory Visit: Admitting: Professional

## 2023-12-03 ENCOUNTER — Encounter: Payer: Self-pay | Admitting: Professional

## 2023-12-03 DIAGNOSIS — F322 Major depressive disorder, single episode, severe without psychotic features: Secondary | ICD-10-CM

## 2023-12-03 DIAGNOSIS — F4310 Post-traumatic stress disorder, unspecified: Secondary | ICD-10-CM

## 2023-12-03 DIAGNOSIS — F411 Generalized anxiety disorder: Secondary | ICD-10-CM | POA: Diagnosis not present

## 2023-12-03 DIAGNOSIS — F902 Attention-deficit hyperactivity disorder, combined type: Secondary | ICD-10-CM | POA: Diagnosis not present
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
## 2023-12-11 ENCOUNTER — Ambulatory Visit: Admitting: Professional

## 2023-12-12 ENCOUNTER — Ambulatory Visit: Admitting: Professional

## 2023-12-17 ENCOUNTER — Ambulatory Visit: Admitting: Professional

## 2023-12-18 ENCOUNTER — Ambulatory Visit: Admitting: Physician Assistant

## 2023-12-25 ENCOUNTER — Ambulatory Visit: Admitting: Professional

## 2023-12-26 ENCOUNTER — Ambulatory Visit: Admitting: Professional

## 2024-01-13 ENCOUNTER — Ambulatory Visit: Admitting: Professional

## 2024-01-23 ENCOUNTER — Ambulatory Visit: Admitting: Professional

## 2024-01-28 ENCOUNTER — Telehealth: Payer: Self-pay | Admitting: Urgent Care
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
## 2024-01-29 ENCOUNTER — Ambulatory Visit

## 2024-01-29 ENCOUNTER — Other Ambulatory Visit (HOSPITAL_COMMUNITY)
Admission: RE | Admit: 2024-01-29 | Discharge: 2024-01-29 | Disposition: A | Source: Ambulatory Visit | Attending: Urgent Care | Admitting: Urgent Care

## 2024-01-29 ENCOUNTER — Ambulatory Visit: Admitting: Urgent Care

## 2024-01-29 ENCOUNTER — Encounter: Payer: Self-pay | Admitting: Urgent Care

## 2024-01-29 DIAGNOSIS — Z7253 High risk bisexual behavior: Secondary | ICD-10-CM | POA: Diagnosis not present

## 2024-01-29 DIAGNOSIS — W503XXA Accidental bite by another person, initial encounter: Secondary | ICD-10-CM

## 2024-01-29 DIAGNOSIS — M79642 Pain in left hand: Secondary | ICD-10-CM

## 2024-01-29 DIAGNOSIS — M79641 Pain in right hand: Secondary | ICD-10-CM

## 2024-01-29 DIAGNOSIS — G44311 Acute post-traumatic headache, intractable: Secondary | ICD-10-CM

## 2024-01-29 DIAGNOSIS — M542 Cervicalgia: Secondary | ICD-10-CM | POA: Diagnosis not present
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
## 2024-01-29 NOTE — Patient Instructions (Signed)
 Please go to [address] to obtain imaging. Please follow up with me early next week

## 2024-01-30 ENCOUNTER — Ambulatory Visit: Admitting: Professional

## 2024-01-30 ENCOUNTER — Ambulatory Visit: Payer: Self-pay | Admitting: Urgent Care

## 2024-01-30 LAB — CMP14+EGFR
ALT: 17 IU/L (ref 0–32)
AST: 24 IU/L (ref 0–40)
Albumin: 4.5 g/dL (ref 4.0–5.0)
Alkaline Phosphatase: 47 IU/L (ref 41–116)
BUN/Creatinine Ratio: 6 — ABNORMAL LOW (ref 9–23)
BUN: 5 mg/dL — ABNORMAL LOW (ref 6–20)
Bilirubin Total: 0.4 mg/dL (ref 0.0–1.2)
CO2: 20 mmol/L (ref 20–29)
Calcium: 9 mg/dL (ref 8.7–10.2)
Chloride: 103 mmol/L (ref 96–106)
Creatinine, Ser: 0.78 mg/dL (ref 0.57–1.00)
Globulin, Total: 2.4 g/dL (ref 1.5–4.5)
Glucose: 74 mg/dL (ref 70–99)
Potassium: 3.9 mmol/L (ref 3.5–5.2)
Sodium: 140 mmol/L (ref 134–144)
Total Protein: 6.9 g/dL (ref 6.0–8.5)
eGFR: 105 mL/min/1.73

## 2024-01-30 LAB — CBC WITH DIFFERENTIAL/PLATELET
Basophils Absolute: 0.1 x10E3/uL (ref 0.0–0.2)
Basos: 1 %
EOS (ABSOLUTE): 0.1 x10E3/uL (ref 0.0–0.4)
Eos: 1 %
Hematocrit: 37.1 % (ref 34.0–46.6)
Hemoglobin: 12.1 g/dL (ref 11.1–15.9)
Immature Grans (Abs): 0 x10E3/uL (ref 0.0–0.1)
Immature Granulocytes: 0 %
Lymphocytes Absolute: 1.6 x10E3/uL (ref 0.7–3.1)
Lymphs: 26 %
MCH: 30.4 pg (ref 26.6–33.0)
MCHC: 32.6 g/dL (ref 31.5–35.7)
MCV: 93 fL (ref 79–97)
Monocytes Absolute: 0.6 x10E3/uL (ref 0.1–0.9)
Monocytes: 9 %
Neutrophils Absolute: 4.1 x10E3/uL (ref 1.4–7.0)
Neutrophils: 63 %
Platelets: 194 x10E3/uL (ref 150–450)
RBC: 3.98 x10E6/uL (ref 3.77–5.28)
RDW: 12.6 % (ref 11.7–15.4)
WBC: 6.4 x10E3/uL (ref 3.4–10.8)

## 2024-01-30 LAB — HIV ANTIBODY (ROUTINE TESTING W REFLEX): HIV Screen 4th Generation wRfx: NONREACTIVE

## 2024-01-30 LAB — HEPATITIS C ANTIBODY: Hep C Virus Ab: NONREACTIVE

## 2024-01-30 LAB — SYPHILIS: RPR W/REFLEX TO RPR TITER AND TREPONEMAL ANTIBODIES, TRADITIONAL SCREENING AND DIAGNOSIS ALGORITHM: RPR Ser Ql: NONREACTIVE

## 2024-01-30 NOTE — Telephone Encounter (Signed)
 I called Amber Donovan to follow up to make sure that she was able to come in on Jan 21 and how was her experience with us . Amber Donovan said that everything went well and she is starting to see an improvement with her bruises. Amber Donovan does have a follow up already scheduled for Jan 27th. She was grateful that we were able to get her seen as soon as possible.

## 2024-02-03 LAB — CYTOLOGY - PAP
Chlamydia: NEGATIVE
Comment: NEGATIVE
Comment: NEGATIVE
Comment: NEGATIVE
Comment: NORMAL
Diagnosis: NEGATIVE
High risk HPV: NEGATIVE
Neisseria Gonorrhea: NEGATIVE
Trichomonas: NEGATIVE

## 2024-02-04 ENCOUNTER — Ambulatory Visit: Admitting: Urgent Care

## 2024-02-06 ENCOUNTER — Ambulatory Visit: Admitting: Professional

## 2024-02-11 ENCOUNTER — Encounter: Payer: Self-pay | Admitting: Urgent Care

## 2024-02-11 ENCOUNTER — Ambulatory Visit: Admitting: Urgent Care

## 2024-02-11 VITALS — BP 104/65 | HR 68 | Ht 64.0 in | Wt 169.0 lb

## 2024-02-11 DIAGNOSIS — H1132 Conjunctival hemorrhage, left eye: Secondary | ICD-10-CM

## 2024-02-11 DIAGNOSIS — M542 Cervicalgia: Secondary | ICD-10-CM

## 2024-02-11 DIAGNOSIS — S060X0D Concussion without loss of consciousness, subsequent encounter: Secondary | ICD-10-CM

## 2024-02-11 DIAGNOSIS — M545 Low back pain, unspecified: Secondary | ICD-10-CM

## 2024-02-11 MED ORDER — AMITRIPTYLINE HCL 10 MG PO TABS: 10.0000 mg | ORAL_TABLET | Freq: Every day | ORAL | 2 refills | Status: AC

## 2024-02-11 MED ORDER — TIZANIDINE HCL 4 MG PO TABS: 4.0000 mg | ORAL_TABLET | Freq: Three times a day (TID) | ORAL | 0 refills | Status: AC | PRN

## 2024-02-11 NOTE — Telephone Encounter (Signed)
 Discussed at office visit today.
